# Patient Record
Sex: Female | Born: 1975 | Race: Black or African American | Hispanic: No | Marital: Married | Smoking: Never smoker
Health system: Southern US, Community
[De-identification: ages and names within clinical notes are randomized; demographics above are authoritative.]

## PROBLEM LIST (undated history)

## (undated) DIAGNOSIS — J45909 Unspecified asthma, uncomplicated: Secondary | ICD-10-CM

## (undated) DIAGNOSIS — I10 Essential (primary) hypertension: Secondary | ICD-10-CM

## (undated) DIAGNOSIS — T7840XA Allergy, unspecified, initial encounter: Secondary | ICD-10-CM

## (undated) HISTORY — DX: Allergy, unspecified, initial encounter: T78.40XA

## (undated) HISTORY — PX: BREAST SURGERY: SHX581

## (undated) HISTORY — DX: Unspecified asthma, uncomplicated: J45.909

---

## 2000-08-16 ENCOUNTER — Ambulatory Visit: Admission: RE | Admit: 2000-08-16 | Discharge: 2000-08-16 | Payer: Self-pay | Admitting: Family Medicine

## 2001-03-02 ENCOUNTER — Ambulatory Visit (HOSPITAL_BASED_OUTPATIENT_CLINIC_OR_DEPARTMENT_OTHER): Admission: RE | Admit: 2001-03-02 | Discharge: 2001-03-03 | Payer: Self-pay | Admitting: Specialist

## 2001-03-02 ENCOUNTER — Encounter (INDEPENDENT_AMBULATORY_CARE_PROVIDER_SITE_OTHER): Payer: Self-pay | Admitting: Specialist

## 2006-04-14 ENCOUNTER — Ambulatory Visit: Payer: Self-pay | Admitting: Gastroenterology

## 2008-05-12 ENCOUNTER — Emergency Department (HOSPITAL_COMMUNITY): Admission: EM | Admit: 2008-05-12 | Discharge: 2008-05-12 | Payer: Self-pay | Admitting: Emergency Medicine

## 2011-02-06 ENCOUNTER — Emergency Department (HOSPITAL_BASED_OUTPATIENT_CLINIC_OR_DEPARTMENT_OTHER)
Admission: EM | Admit: 2011-02-06 | Discharge: 2011-02-06 | Disposition: A | Discharge status: Home / Self Care | Payer: BC Managed Care – PPO | Attending: Emergency Medicine | Admitting: Emergency Medicine

## 2011-02-06 DIAGNOSIS — R1011 Right upper quadrant pain: Secondary | ICD-10-CM | POA: Insufficient documentation

## 2011-02-06 DIAGNOSIS — J45909 Unspecified asthma, uncomplicated: Secondary | ICD-10-CM | POA: Insufficient documentation

## 2011-02-06 DIAGNOSIS — I10 Essential (primary) hypertension: Secondary | ICD-10-CM | POA: Insufficient documentation

## 2011-02-06 LAB — DIFFERENTIAL
Eosinophils Absolute: 0.3 10*3/uL (ref 0.0–0.7)
Eosinophils Relative: 3 % (ref 0–5)
Lymphocytes Relative: 27 % (ref 12–46)
Lymphs Abs: 2.9 10*3/uL (ref 0.7–4.0)
Monocytes Absolute: 0.6 10*3/uL (ref 0.1–1.0)
Monocytes Relative: 6 % (ref 3–12)

## 2011-02-06 LAB — URINALYSIS, ROUTINE W REFLEX MICROSCOPIC
Glucose, UA: NEGATIVE mg/dL
Hgb urine dipstick: NEGATIVE
Ketones, ur: NEGATIVE mg/dL
Protein, ur: NEGATIVE mg/dL
Urobilinogen, UA: 0.2 mg/dL (ref 0.0–1.0)

## 2011-02-06 LAB — COMPREHENSIVE METABOLIC PANEL
Alkaline Phosphatase: 66 U/L (ref 39–117)
BUN: 7 mg/dL (ref 6–23)
Creatinine, Ser: 0.7 mg/dL (ref 0.4–1.2)
Glucose, Bld: 101 mg/dL — ABNORMAL HIGH (ref 70–99)
Potassium: 4 mEq/L (ref 3.5–5.1)
Total Protein: 7.5 g/dL (ref 6.0–8.3)

## 2011-02-06 LAB — CBC
HCT: 38.7 % (ref 36.0–46.0)
MCH: 29.2 pg (ref 26.0–34.0)
MCHC: 34.6 g/dL (ref 30.0–36.0)
MCV: 84.3 fL (ref 78.0–100.0)
Platelets: 402 10*3/uL — ABNORMAL HIGH (ref 150–400)
RDW: 13.1 % (ref 11.5–15.5)
WBC: 10.9 10*3/uL — ABNORMAL HIGH (ref 4.0–10.5)

## 2011-02-06 LAB — LIPASE, BLOOD: Lipase: 22 U/L (ref 11–59)

## 2011-02-15 NOTE — Op Note (Signed)
[place]. [facility]  Patient:    Shannon Martinez, Shannon Martinez                     MRN: [id number] Proc. Date: 03/02/01 Adm. Date:  [id number] Attending:  Gustavus Messing                           Operative Report  PROCEDURES: 1. Bilateral breast reductions using the inferior pedicle technique. 2. Reduction mammoplasty. 3. Excision of accessory breast tissue.  SURGEON:  Yaakov Guthrie. Shon Hough, M.D.  ASSISTANT:  Margaretha Sheffield.  ANESTHESIA:  General.  CLINICAL NOTE:  This is a 35 year old lady with severe, severe macromastia, back and shoulder pain secondary to the large, pendulous breasts.  She has increased intertrigo and accessory breast tissue.  DESCRIPTION OF PROCEDURE:  Preoperatively, the patient was set up and drawn for the reduction mammoplasty.  She then underwent general anesthesia, intubated orally.  Prep was done to the chest and breast areas in a routine fashion using Betadine soap and solution and walled off with sterile towels and drapes so as to make a sterile field.  Xylocaine 0.25% with epinephrine 1:400,000 concentration was injected locally into the incision line.  The lines were scored with a #15 blade, then the skin over the inferior pedicles de-epithelialized with a #20 blade.  The medial and lateral fatty dermal pedicles were excised down to fascia.  Out laterally, more tissue was taken, as well as accessory breast tissue over the latissimus dorsi-axillary regions. After proper hemostasis, the flaps were then transposed after the new keyhole area was debulked and stayed with 3-0 Prolene.  Subcutaneous closed with 3-0 Monocryl x 2 layers and then a running subcuticular stitch of 3-0 Monocryl. The drains were placed, #10 Blake, fully-fluted, and then brought out through the lateralmost portion of the incision and secured with 3-0 Prolene.  The wounds were cleansed.  Steri-Strips and soft dressing were applied to all the areas.  She  withstood the procedures very well, was taken to the recovery room in good condition. DD:  03/02/01 TD:  03/02/01 Job: [id number]

## 2011-02-15 NOTE — Assessment & Plan Note (Signed)
[name]                           GASTROENTEROLOGY OFFICE NOTE   Shannon Martinez, SURMAN                     MRN:          [id number]  DATE:04/14/2006                            DOB:          05-09-1976    HISTORY:  This 35 year old African American female is self referred for  evaluation of reflux symptoms and abdominal pain.   HISTORY OF PRESENT ILLNESS:  She notes intermittent nighttime heartburn and  indigestion associated with nausea and regurgitation.  She has had  intermittent chest and epigastric pain, mainly related to the burning  symptoms.  She notes long-term problems with gas and bloating.  She has  tried Beano and Gas-X without any help.  All the above symptoms have been  present for the last few months.  She uses Tums quite frequently and notes  significant belching, as well.  For about the past week, she has had very  loose stools that are dark brown in color but not black, tarry, sticky, red,  or maroon.  She notes some lower abdominal cramping.  She states her  appetite is decreased.  However, she has gained about 15 pounds since  December.  She has no dysphagia, odynophagia, change in stool caliber,  melena, or hematochezia.  No family members with colon cancer, colon polyps,  or inflammatory bowel disease.   PAST MEDICAL HISTORY:  Hypertension, asthma.   PAST SURGICAL HISTORY:  Status post breast surgery in 2000.   MEDICATIONS:  Listed on the chart, updated and reviewed.   SOCIAL HISTORY:  Per the handwritten form.   REVIEW OF SYSTEMS:  Several areas positive per the handwritten form.   PHYSICAL EXAMINATION:  GENERAL:  Overweight, in no acute distress.  VITAL SIGNS:  Height 5 feet 4 inches, weight 204.8 pounds.  Blood pressure  122/86, pulse 78 and regular.  HEENT:  Anicteric sclerae.  Oropharynx clear.  CHEST:  Clear to auscultation bilaterally.  CARDIAC:  Regular rate and rhythm without murmurs appreciated.  ABDOMEN:  Soft with mild epigastric to deep palpation.  No rebound or  guarding.  No palpable organomegaly, masses or hernias.  Normoactive bowel  sounds.  EXTREMITIES:  Without clubbing, cyanosis or edema.  NEUROLOGIC:  Alert and oriented x 3.  Grossly nonfocal.   ASSESSMENT AND PLAN:  Gastroesophageal reflux disease associated with  intestinal gas and a recent change in bowel habits.  She may take Prilosec  20 mg OTC one p.o. q.a.m.  Begin a low-gas diet and standard antireflux  measures.  Obtain stool Hemoccults.  Risks, benefits, and alternatives to  upper endoscopy with possible biopsy discussed with the patient and she  consents to proceed.  This will be scheduled electively.  Consider abdominal  ultrasound imaging  and blood work if her upper endoscopy is not diagnostic and her symptoms do  not respond to Prilosec.                                   [name]. Pleas Koch., MD, Clementeen Graham   MTS/MedQ  DD:  04/27/2006  DT:  04/27/2006  Job #:  [id number]

## 2011-02-19 ENCOUNTER — Other Ambulatory Visit: Payer: Self-pay | Admitting: Family Medicine

## 2011-02-19 DIAGNOSIS — R102 Pelvic and perineal pain: Secondary | ICD-10-CM

## 2011-03-04 ENCOUNTER — Ambulatory Visit
Admission: RE | Admit: 2011-03-04 | Discharge: 2011-03-04 | Disposition: A | Discharge status: Home / Self Care | Payer: BC Managed Care – PPO | Source: Ambulatory Visit | Attending: Family Medicine | Admitting: Family Medicine

## 2011-03-04 DIAGNOSIS — R102 Pelvic and perineal pain: Secondary | ICD-10-CM

## 2011-06-28 LAB — URINALYSIS, ROUTINE W REFLEX MICROSCOPIC
Bilirubin Urine: NEGATIVE
Ketones, ur: NEGATIVE
Nitrite: NEGATIVE
Specific Gravity, Urine: 1.027
Urobilinogen, UA: 0.2

## 2011-06-28 LAB — URINE MICROSCOPIC-ADD ON

## 2011-06-28 LAB — POCT PREGNANCY, URINE: Preg Test, Ur: NEGATIVE

## 2011-09-02 ENCOUNTER — Other Ambulatory Visit: Payer: Self-pay | Admitting: Family Medicine

## 2011-09-02 ENCOUNTER — Ambulatory Visit
Admission: RE | Admit: 2011-09-02 | Discharge: 2011-09-02 | Disposition: A | Discharge status: Home / Self Care | Payer: BC Managed Care – PPO | Source: Ambulatory Visit | Attending: Family Medicine | Admitting: Family Medicine

## 2011-09-03 ENCOUNTER — Inpatient Hospital Stay: Admission: RE | Admit: 2011-09-03 | Payer: BC Managed Care – PPO | Source: Ambulatory Visit

## 2011-11-19 ENCOUNTER — Other Ambulatory Visit: Payer: Self-pay | Admitting: Family Medicine

## 2011-12-29 ENCOUNTER — Ambulatory Visit (INDEPENDENT_AMBULATORY_CARE_PROVIDER_SITE_OTHER): Payer: BC Managed Care – PPO | Admitting: Family Medicine

## 2011-12-29 DIAGNOSIS — K209 Esophagitis, unspecified without bleeding: Secondary | ICD-10-CM

## 2011-12-29 DIAGNOSIS — K219 Gastro-esophageal reflux disease without esophagitis: Secondary | ICD-10-CM

## 2011-12-29 MED ORDER — GI COCKTAIL ~~LOC~~: 30.0000 mL | Freq: Once | ORAL | Status: DC

## 2011-12-29 MED ORDER — SUCRALFATE 1 G PO TABS: 1.0000 g | ORAL_TABLET | Freq: Two times a day (BID) | ORAL | Status: AC

## 2011-12-29 MED ORDER — SUCRALFATE 1 G PO TABS: 1.0000 g | ORAL_TABLET | Freq: Two times a day (BID) | ORAL | Status: DC

## 2011-12-29 NOTE — Progress Notes (Signed)
  Subjective:    Patient ID: Shannon Martinez, female    DOB: 06-24-1976, 36 y.o.   MRN: [id number]  HPI  Patient presents complaining of burning chest pain.  Continues to experience pain in center of chest and now in center of back.  Took Prevacid, (2) antacids and then strip of Gas X; symptoms eased then returned today.  Drank sprite to relieve symptoms burped however did not relieve symptoms  Feels sore anterior chest  Symptoms started after coffee/waffles/burger/onion rings  Denies SOB Review of Systems     Objective:   Physical Exam  Constitutional: She appears well-developed.  HENT:  Mouth/Throat: Posterior oropharyngeal erythema present.  Neck: Neck supple.  Cardiovascular: Normal rate, regular rhythm and normal heart sounds.   Pulmonary/Chest: Effort normal.  Abdominal: Soft. There is tenderness (epigastrum).  Neurological: She is alert.      Symptoms relieved by GI cocktail    Assessment & Plan:   1. Chest pain  EKG 12-Lead  2. GERD (gastroesophageal reflux disease)  gi cocktail (Maalox,Lidocaine,Donnatal), H. pylori antibody, IgG  3. Esophagitis  sucralfate (CARAFATE) 1 G tablet   Patient education information provided regarding GERD and esophagitis Reviewed dietary options that would prevent aggravation of symptoms

## 2012-01-08 ENCOUNTER — Ambulatory Visit (INDEPENDENT_AMBULATORY_CARE_PROVIDER_SITE_OTHER): Payer: BC Managed Care – PPO | Admitting: Emergency Medicine

## 2012-01-08 VITALS — BP 112/80 | HR 71 | Temp 98.0°F | Resp 16 | Ht 64.0 in | Wt 204.0 lb

## 2012-01-08 DIAGNOSIS — T7840XA Allergy, unspecified, initial encounter: Secondary | ICD-10-CM

## 2012-01-08 MED ORDER — DIPHENHYDRAMINE HCL 50 MG/ML IJ SOLN
50.0000 mg | Freq: Once | INTRAMUSCULAR | Status: AC
  Administered 2012-01-08: 50 mg via INTRAMUSCULAR

## 2012-01-08 MED ORDER — EPINEPHRINE 0.3 MG/0.3ML IJ DEVI: 0.3000 mg | Freq: Once | INTRAMUSCULAR | Status: AC

## 2012-01-08 NOTE — Progress Notes (Signed)
  Subjective:    Patient ID: Shannon Martinez, female    DOB: 25-Oct-1975, 36 y.o.   MRN: [id number]  HPI patient presents after coming in contact with latex approximately 30 minutes ago. She has a severe latex allergy. Apparently someone handed balloons to her and they rubbed up against her skin. She enters now with itching of her arms around her lips and also on the front of her legs. She has not broken out in a rash. She does not have any swelling of her tongue or lips. She is having no wheezing. She's not having chest pain    Review of Systems are as relates to the present illness the     Objective:   Physical Exam  Constitutional: She appears well-developed and well-nourished.  Neck: No tracheal deviation present. No thyromegaly present.  Cardiovascular: Normal rate and regular rhythm.  Exam reveals no gallop and no friction rub.   No murmur heard. Pulmonary/Chest: No respiratory distress. She has no wheezes. She has no rales. She exhibits no tenderness.  Skin:       There are no hives noted. There is no outbreak noted on her skin. He immediately washed her scan WITH water.          Assessment & Plan:  Patient here with pruritus of the skin following latex exposure. She was given 50 of Benadryl IM 10 mg of Zyrtec by mouth. We'll observe for now. We'll make sure she has a prescription for an EpiPen for emergencies. Following administration of IM Benadryl and oral Zyrtec patient had no progression of her allergic symptoms. We'll send in a prescription for an EpiPen dual pack for her to have she can continue Benadryl 25 1-2 every 4-6 hours.

## 2012-02-16 ENCOUNTER — Ambulatory Visit (INDEPENDENT_AMBULATORY_CARE_PROVIDER_SITE_OTHER): Payer: BC Managed Care – PPO | Admitting: Family Medicine

## 2012-02-16 ENCOUNTER — Ambulatory Visit: Payer: BC Managed Care – PPO

## 2012-02-16 VITALS — BP 139/87 | HR 60 | Temp 98.5°F | Resp 16 | Ht 64.0 in | Wt 203.0 lb

## 2012-02-16 DIAGNOSIS — M79603 Pain in arm, unspecified: Secondary | ICD-10-CM

## 2012-02-16 DIAGNOSIS — M542 Cervicalgia: Secondary | ICD-10-CM

## 2012-02-16 DIAGNOSIS — M79609 Pain in unspecified limb: Secondary | ICD-10-CM

## 2012-02-16 MED ORDER — CYCLOBENZAPRINE HCL 5 MG PO TABS: ORAL_TABLET | ORAL | Status: AC

## 2012-02-16 MED ORDER — MELOXICAM 7.5 MG PO TABS: ORAL_TABLET | ORAL | Status: DC

## 2012-02-16 NOTE — Progress Notes (Signed)
  Subjective:    Patient ID: Shannon Martinez, female    DOB: 10-24-1975, 36 y.o.   MRN: [id number]  HPI Shannon Martinez is a 36 y.o. female Complains of L neck to shoulder to arm pain (running from L neck down) - noticed a week ago.  Numbness in L thumb for 3-4 days.  No chest pain/SOB.  Pain only - no weakness.   [facility].  ? muscle spasm.   Primarily R hand dominant.  Tx: tens unit (has for back) -x3 - no relief.  Naprosyn x past 2 days - no relief., ice pack, moist heat - no relief.  housing auditor.    Review of Systems  Constitutional: Negative for activity change.  HENT: Positive for neck pain and neck stiffness.   Respiratory: Negative for chest tightness and shortness of breath.   Cardiovascular: Negative for chest pain.  Musculoskeletal: Positive for arthralgias.          Objective:   Physical Exam  Constitutional: She is oriented to person, place, and time. She appears well-developed and well-nourished.  Cardiovascular: Normal rate, regular rhythm, normal heart sounds and intact distal pulses.   Pulmonary/Chest: Effort normal and breath sounds normal.  Musculoskeletal:       Left shoulder: Normal. She exhibits normal range of motion.       Cervical back: She exhibits decreased range of motion, tenderness, pain and spasm. She exhibits no bony tenderness.       Back:  Neurological: She is alert and oriented to person, place, and time. She has normal strength and normal reflexes. No cranial nerve deficit or sensory deficit. She exhibits normal muscle tone.  Reflex Scores:      Tricep reflexes are 2+ on the right side and 2+ on the left side.      Bicep reflexes are 2+ on the right side and 2+ on the left side.      Brachioradialis reflexes are 2+ on the right side and 2+ on the left side. Skin: Skin is warm and dry. No rash noted.  Psychiatric: She has a normal mood and affect. Her behavior is normal.   [facility] reading  (PRIMARY) by  Dr. Neva Seat: C-spine:. Decreased lordosis, o/w negative.      Assessment & Plan:  Shannon Martinez is a 36 y.o. female L neck pain, muscle spasm, with pain and dysesthesia to thumb (C6 distribution).  Discussed prednisone, but declined at this point. Can try  flexeril 5 mg QHS  to Q8 prn, trial of Mobic 7.5 mg 1-2 qd, continue heat, gentle rom and stretches per neck manual, and if not improving in next week to 10 days, would consider prednisone taper.  Consider MRI C-spine if no improvement with this.

## 2012-02-18 ENCOUNTER — Telehealth: Payer: Self-pay

## 2012-02-18 NOTE — Telephone Encounter (Signed)
PATIENT SAW DR. Neva Seat FOR HER SHOULDER PAIN.  SHE WOULD LIKE A NOTE OR PRESCRIPTION FOR ANY ORTHOPAEDIC DEVICE SHE CAN USE TO HELP HER FOR HER PROBLEM, SUCH AS A PILLOW.  HER COMPANY WILL REIMBURSE HER FOR ANYTHING LIKE THIS.  PLEASE CALL.

## 2012-02-19 NOTE — Telephone Encounter (Signed)
Dr Neva Seat, is there any ortho device you can Rx or suggest for pt?

## 2012-02-20 NOTE — Telephone Encounter (Signed)
Called patient mailbox has not been set up so unable to leave message

## 2012-02-20 NOTE — Telephone Encounter (Signed)
Pt CB and stated she has already purchased an ortho pillow that is called "Soothe and Ciser" pillow that is designed to use for neck pain. She is using it as directed which is to use periodically for 10-15 mins at a time. She is doing this after using the moist heat as directed by Dr Neva Seat and then switches to her reg pillow to try to sleep. Her employer has said that if Dr Neva Seat writes a letter/order for pt to use this pillow, they will reimburse her for it since it is for her medical condition. Asked pt about her current status, and she isn't really getting any relief yet - still having to take the pain med/ibuprofen, and would like to try the prednisone if the doctor feels that it might help.

## 2012-02-20 NOTE — Telephone Encounter (Signed)
Because this was likely neck related, I don't know of a particular brace that would help.  Is she taking the meds prescribed?  We could try prednisone sooner if she is not getting any relief.

## 2012-03-02 NOTE — Telephone Encounter (Signed)
Notified pt of note being ready for p/up and gave her Dr Paralee Cancel f/up instr's. Pt agreed.

## 2012-03-02 NOTE — Telephone Encounter (Signed)
Call pt - follow up with physical therapy, and if not improving - return to clinic to recheck and discuss prednisone.  Also - advise her that a note on a  prescription is up front for pickup for the use of her neck brace.

## 2012-03-02 NOTE — Telephone Encounter (Signed)
Pt called back to check status of letter/order for ortho pillow. Also, pt wanted Dr Neva Seat to know that she has started therapy back up at [facility] as he had suggested.

## 2012-03-18 ENCOUNTER — Telehealth: Payer: Self-pay

## 2012-03-18 NOTE — Telephone Encounter (Signed)
BLOOD PRESSURE MEDICINE diltiazem (TIAZAC) 120 MG 24 hr capsule hydrochlorothiazide (MICROZIDE) 12.5 MG capsule  [facility] [address]  BEST NUMBER IS [phone]

## 2012-03-19 ENCOUNTER — Other Ambulatory Visit: Payer: Self-pay | Admitting: Physician Assistant

## 2012-03-19 MED ORDER — DILTIAZEM HCL ER BEADS 120 MG PO CP24: 120.0000 mg | ORAL_CAPSULE | Freq: Every day | ORAL | Status: DC

## 2012-03-19 NOTE — Telephone Encounter (Signed)
Sent in one refill to her pharmacy.

## 2012-04-24 ENCOUNTER — Other Ambulatory Visit: Payer: Self-pay | Admitting: Physician Assistant

## 2012-04-24 NOTE — Telephone Encounter (Signed)
Pt is currently at [facility] stating that we only called in one rx and not the other. She will be waiting since she is going out of town this evening.  [phone]  [facility] - [place], [place] - [address]

## 2012-04-26 ENCOUNTER — Telehealth: Payer: Self-pay | Admitting: *Deleted

## 2012-04-26 NOTE — Telephone Encounter (Signed)
Dala Dock Ribarsch 04/24/2012 4:36 PM Signed  Pt is currently at [facility] stating that we only called in one rx and not the other. She will be waiting since she is going out of town this evening.  [phone]  [facility] - [place], [place] - [address]

## 2012-05-10 ENCOUNTER — Telehealth: Payer: Self-pay | Admitting: Family Medicine

## 2012-05-10 NOTE — Telephone Encounter (Signed)
Outside call for blood pressure 163/100.  Calling about ha and elevated blood pressure - wanted to know if something can be called in. Having headaches - 144/90, 153/100.  Taking normal blood pressure meds - hctz 12.5mg  and diltiazem 120mg  - at 10 am this morning.  Took extra dose of hctz 12.5 at 5pm tonight.  Headache and slightnausea, no slurred speech.  No chest pains.  Just feels tired.  No focal weakness. Feels a little blurry vision when looking up stuff online tonight - trouble with initial focus, but otherwise ok. . No recent labs for blood pressure meds. Usually pressure under better control.  No missed doses.  Discussed eval at ER tonight if blurry vision or ha/nausea not improving in next few hours - sooner if any worsening.  Recheck in clinic tomorrow to eval control as long as improving tonight.  Understanding expressed.

## 2012-05-11 ENCOUNTER — Ambulatory Visit (INDEPENDENT_AMBULATORY_CARE_PROVIDER_SITE_OTHER): Payer: BC Managed Care – PPO | Admitting: Family Medicine

## 2012-05-11 VITALS — BP 142/108 | HR 69 | Temp 98.0°F | Resp 16 | Ht 64.0 in | Wt 197.8 lb

## 2012-05-11 DIAGNOSIS — I10 Essential (primary) hypertension: Secondary | ICD-10-CM

## 2012-05-11 MED ORDER — HYDROCHLOROTHIAZIDE 25 MG PO TABS: 25.0000 mg | ORAL_TABLET | Freq: Every day | ORAL | Status: DC

## 2012-05-11 NOTE — Patient Instructions (Addendum)
Your should receive a call or letter about your lab results within the next week to 10 days.   Increasing hctz to 25mg  each day.  (take 2 of 12.5mg  capsules until out, then start 25 mg tab  - 1 each day). Continue diltiazem 120mg  each day.  Recheck in next 6 months.  Return to the clinic or go to the nearest emergency room if any of your symptoms worsen or new symptoms occur.

## 2012-05-11 NOTE — Progress Notes (Signed)
  Subjective:    Patient ID: Shannon Martinez, female    DOB: 06-30-1976, 36 y.o.   MRN: [id number]  HPI Shannon Martinez is a 36 y.o. female Hx of HTN - last creatinine 0.75 in 11/12. See outside call last night. Had headache and elevated blood pressures. Takes taztia xt 120mg  qd, hctz 12.5mg  qd.  Denies recent missed doses. Took extra dose of hctz 12.5mg  last night. (had been on 25mg  hctz in past but decresed as blood pressure was improved.   Has been losing weight with running 4x/week.  Had eaten barbecue few days ago - pork.   BP remained high overnight., no chest pains.  Headache resolved, then back this morning.  No blurry vision or weakness. Home bp's 153/101.    Fasting today - has not taken meds yet.  Usually takes meds at 10:30.  Review of Systems  Constitutional: Negative for fatigue and unexpected weight change.  Eyes: Negative for visual disturbance.  Respiratory: Negative for chest tightness and shortness of breath.   Cardiovascular: Negative for chest pain, palpitations and leg swelling.  Gastrointestinal: Negative for abdominal pain and blood in stool.  Neurological: Positive for headaches. Negative for dizziness, syncope and light-headedness.   As above.       Objective:   Physical Exam  Constitutional: She is oriented to person, place, and time. She appears well-developed and well-nourished.  HENT:  Head: Normocephalic and atraumatic.  Eyes: Conjunctivae and EOM are normal. Pupils are equal, round, and reactive to light.  Neck: Carotid bruit is not present.  Cardiovascular: Normal rate, regular rhythm, normal heart sounds and intact distal pulses.   Pulmonary/Chest: Effort normal and breath sounds normal.  Abdominal: Soft. She exhibits no pulsatile midline mass. There is no tenderness.  Neurological: She is alert and oriented to person, place, and time.  Skin: Skin is warm and dry.  Psychiatric: She has a normal mood and affect. Her behavior is normal.          Assessment & Plan:  Shannon Martinez is a 36 y.o. female 1. HTN (hypertension)  Comprehensive metabolic panel, Lipid panel  likely elevated with sodium intake.  Can try higher dose of hctz 25mg , continue diltiazem at same dose.   Rtc/er precautions.   Patient Instructions  Your should receive a call or letter about your lab results within the next week to 10 days.   Increasing hctz to 25mg  each day.  (take 2 of 12.5mg  capsules until out, then start 25 mg tab  - 1 each day). Continue diltiazem 120mg  each day.  Recheck in next 6 months.  Return to the clinic or go to the nearest emergency room if any of your symptoms worsen or new symptoms occur.

## 2012-05-12 LAB — COMPREHENSIVE METABOLIC PANEL
ALT: 18 U/L (ref 0–35)
BUN: 9 mg/dL (ref 6–23)
CO2: 30 mEq/L (ref 19–32)
Calcium: 9.7 mg/dL (ref 8.4–10.5)
Chloride: 100 mEq/L (ref 96–112)
Creat: 0.88 mg/dL (ref 0.50–1.10)
Glucose, Bld: 92 mg/dL (ref 70–99)

## 2012-05-12 LAB — LIPID PANEL
Cholesterol: 157 mg/dL (ref 0–200)
Total CHOL/HDL Ratio: 3.5 Ratio

## 2012-05-25 ENCOUNTER — Other Ambulatory Visit: Payer: Self-pay | Admitting: Physician Assistant

## 2012-06-05 ENCOUNTER — Ambulatory Visit (INDEPENDENT_AMBULATORY_CARE_PROVIDER_SITE_OTHER): Payer: BC Managed Care – PPO | Admitting: Emergency Medicine

## 2012-06-05 VITALS — BP 130/86 | HR 71 | Temp 98.5°F | Resp 18 | Ht 63.5 in | Wt 201.0 lb

## 2012-06-05 DIAGNOSIS — IMO0002 Reserved for concepts with insufficient information to code with codable children: Secondary | ICD-10-CM

## 2012-06-05 DIAGNOSIS — A088 Other specified intestinal infections: Secondary | ICD-10-CM

## 2012-06-05 DIAGNOSIS — L02419 Cutaneous abscess of limb, unspecified: Secondary | ICD-10-CM

## 2012-06-05 MED ORDER — ONDANSETRON 8 MG PO TBDP: 8.0000 mg | ORAL_TABLET | Freq: Three times a day (TID) | ORAL | Status: AC | PRN

## 2012-06-05 MED ORDER — SULFAMETHOXAZOLE-TRIMETHOPRIM 800-160 MG PO TABS: 1.0000 | ORAL_TABLET | Freq: Two times a day (BID) | ORAL | Status: AC

## 2012-06-05 NOTE — Progress Notes (Signed)
Date:  06/05/2012   Name:  Shannon Martinez   DOB:  1976/08/14   MRN:  [id number] Gender: female Age: 36 y.o.  PCP:  Dois Davenport., MD    Chief Complaint: Insect Bite   History of Present Illness:  Shannon Martinez is a 36 y.o. pleasant patient who presents with the following:  Up in [place] over the past weekend cleaning out her mother's house and was "bitten by a spider" three times while she was sleeping.  She has a lesion on her neck and two on her left arm that are pruritic but are not painful or tender.  The week prior to her trip on Tuesday, she developed a clammy sweaty feeling and felt general malaise associated with vomiting and diarrhea after eating at [facility].  Had a similar episode while in [place].  Also associated with vomiting and diarrhea which was watery but no blood, mucous or pus.  Not associated with fever or chills, abdominal pain.  Says the symptoms resolved in about 15 minutes.  There is no problem list on file for this patient.   No past medical history on file.  No past surgical history on file.  History  Substance Use Topics  . Smoking status: Never Smoker   . Smokeless tobacco: Not on file  . Alcohol Use: Yes     social    No family history on file.  Allergies  Allergen Reactions  . Latex   . Lisinopril Cough  . Penicillins   . Losartan Rash    Medication list has been reviewed and updated.  Current Outpatient Prescriptions on File Prior to Visit  Medication Sig Dispense Refill  . albuterol (PROVENTIL HFA;VENTOLIN HFA) 108 (90 BASE) MCG/ACT inhaler Inhale 2 puffs into the lungs every 6 (six) hours as needed.      Marland Kitchen albuterol (PROVENTIL) (2.5 MG/3ML) 0.083% nebulizer solution Take 2.5 mg by nebulization every 6 (six) hours as needed.      . cetirizine (ZYRTEC) 10 MG tablet Take 10 mg by mouth daily.      Marland Kitchen EPINEPHrine (EPI-PEN) 0.3 mg/0.3 mL DEVI Inject 0.3 mLs (0.3 mg total) into the muscle once.  2 Device  3  . Flaxseed,  Linseed, (FLAX SEED OIL) 1000 MG CAPS Take by mouth daily.      . flunisolide (AEROBID) 250 MCG/ACT inhaler Inhale 2 puffs into the lungs 2 (two) times daily.      . hydrochlorothiazide (HYDRODIURIL) 25 MG tablet Take 1 tablet (25 mg total) by mouth daily.  90 tablet  3  . lansoprazole (PREVACID) 30 MG capsule TAKE ONE CAPSULE BY MOUTH EVERY DAY  30 capsule  2  . meloxicam (MOBIC) 7.5 MG tablet Take 1 to 2 tablets per day as needed for pain/inflammation.  30 tablet  0  . Multiple Vitamins-Minerals (MULTIVITAMIN WITH MINERALS) tablet Take 1 tablet by mouth daily.      Marland Kitchen TAZTIA XT 120 MG 24 hr capsule TAKE ONE CAPSULE BY MOUTH EVERY DAY  30 each  5  . sucralfate (CARAFATE) 1 G tablet Take 1 tablet (1 g total) by mouth 2 (two) times daily.  30 tablet  1   Current Facility-Administered Medications on File Prior to Visit  Medication Dose Route Frequency Provider Last Rate Last Dose  . gi cocktail (Maalox,Lidocaine,Donnatal)  30 mL Oral Once Dois Davenport, MD        Review of Systems:  As per HPI, otherwise negative.    Physical Examination:  Filed Vitals:   06/05/12 1707  BP: 130/86  Pulse: 71  Temp: 98.5 F (36.9 C)  Resp: 18   Filed Vitals:   06/05/12 1707  Height: 5' 3.5" (1.613 m)  Weight: 201 lb (91.173 kg)   Body mass index is 35.05 kg/(m^2). Ideal Body Weight: Weight in (lb) to have BMI = 25: 143.1    GEN: WDWN, NAD, Non-toxic, Alert & Oriented x 3 HEENT: Atraumatic, Normocephalic.  Ears and Nose: No external deformity. EXTR: No clubbing/cyanosis/edema NEURO: Normal gait.  PSYCH: Normally interactive. Conversant. Not depressed or anxious appearing.  Calm demeanor.  Abdomen soft not tender SKIN:  Multiple small abscesses left arm and neck  Assessment and Plan: Abscess arm and neck History of gastroenteritis Septra   Shannon Dane, MD

## 2012-06-08 ENCOUNTER — Encounter: Payer: Self-pay | Admitting: *Deleted

## 2012-06-08 DIAGNOSIS — M25512 Pain in left shoulder: Secondary | ICD-10-CM

## 2012-06-11 ENCOUNTER — Telehealth: Payer: Self-pay

## 2012-06-11 ENCOUNTER — Ambulatory Visit (INDEPENDENT_AMBULATORY_CARE_PROVIDER_SITE_OTHER): Payer: BC Managed Care – PPO | Admitting: Family Medicine

## 2012-06-11 VITALS — BP 124/78 | HR 86 | Temp 98.3°F | Resp 16 | Ht 63.5 in | Wt 197.2 lb

## 2012-06-11 DIAGNOSIS — N951 Menopausal and female climacteric states: Secondary | ICD-10-CM

## 2012-06-11 DIAGNOSIS — R232 Flushing: Secondary | ICD-10-CM

## 2012-06-11 DIAGNOSIS — R197 Diarrhea, unspecified: Secondary | ICD-10-CM

## 2012-06-11 DIAGNOSIS — R112 Nausea with vomiting, unspecified: Secondary | ICD-10-CM

## 2012-06-11 LAB — POCT CBC
Hemoglobin: 12.7 g/dL (ref 12.2–16.2)
Lymph, poc: 3.8 — AB (ref 0.6–3.4)
MCHC: 30.9 g/dL — AB (ref 31.8–35.4)
MPV: 9.3 fL (ref 0–99.8)
POC Granulocyte: 6 (ref 2–6.9)
POC MID %: 7.9 %M (ref 0–12)
RDW, POC: 14.7 %

## 2012-06-11 LAB — BASIC METABOLIC PANEL
CO2: 28 mEq/L (ref 19–32)
Chloride: 99 mEq/L (ref 96–112)
Glucose, Bld: 82 mg/dL (ref 70–99)
Potassium: 3.8 mEq/L (ref 3.5–5.3)
Sodium: 136 mEq/L (ref 135–145)

## 2012-06-11 LAB — FOLLICLE STIMULATING HORMONE: FSH: 1.6 m[IU]/mL

## 2012-06-11 NOTE — Progress Notes (Signed)
[facility] [address] [place] [address] [phone]  Date:  06/11/2012   Name:  Shannon Martinez   DOB:  Jun 16, 1976   MRN:  [id number]  PCP:  Dois Davenport., MD    Chief Complaint: Follow-up and Insect Bite   History of Present Illness:  Shannon SUTLIFF is a 36 y.o. very pleasant female patient who presents with the following:  She was here about 5 days ago with illness- she had a couple of short episodes of clammy/ sweaty feeling, nausea/ vomiting and diarrhea.  She was noted to have some small abscesses on her left arm and neck, and was started on septra.  These lesions are now better.    Here today for a recheck- the skin infection is better, but she is concerned about her other episodes.   About 2 weeks ago while at work she noted a "hot flash" with feeling very hot and sweating, was able to tell coworker that she did not feel well.  She had to run to the bathroom to vomit, and then had diarrhea.  She was running with sweat and looked pale.  She had abdominal cramps but not pain.  This resolved after about 15 minutes.    Then a couple of days later she was in [place] to visit her mother.  She had another similar episode while at her mother's house.  It again lasted 15 minutes.    Today she had another hot flash, and stood in front of a fan at her job.  She was able to cool herself off, and did not have vomiting.  She decided she should come in for an evaluation.  Currently she feels fine  Her LMP was last month- she should be due soon.  She does not think that she could be pregnant.   She is not aware of any family history of early menopause.    She does not feel like she is having panic or anxiety attacks  In between these episodes she has been able to eat and drink without a problem.  She has started running a few months ago but this is dong well.  No other diarrhea.  No abdominal pain.   No SOB, no chest pain- she has no pain when she is  jogging, and she is up to run/ walking up to 5 miles a few times a week.      Patient Active Problem List  Diagnosis  . Left shoulder pain    No past medical history on file.  No past surgical history on file.  History  Substance Use Topics  . Smoking status: Never Smoker   . Smokeless tobacco: Not on file  . Alcohol Use: Yes     social    No family history on file.  Allergies  Allergen Reactions  . Latex   . Lisinopril Cough  . Penicillins   . Losartan Rash    Medication list has been reviewed and updated.  Current Outpatient Prescriptions on File Prior to Visit  Medication Sig Dispense Refill  . albuterol (PROVENTIL HFA;VENTOLIN HFA) 108 (90 BASE) MCG/ACT inhaler Inhale 2 puffs into the lungs every 6 (six) hours as needed.      Marland Kitchen albuterol (PROVENTIL) (2.5 MG/3ML) 0.083% nebulizer solution Take 2.5 mg by nebulization every 6 (six) hours as needed.      . cetirizine (ZYRTEC) 10 MG tablet Take 10 mg by mouth daily.      Marland Kitchen  EPINEPHrine (EPI-PEN) 0.3 mg/0.3 mL DEVI Inject 0.3 mLs (0.3 mg total) into the muscle once.  2 Device  3  . Flaxseed, Linseed, (FLAX SEED OIL) 1000 MG CAPS Take by mouth daily.      . flunisolide (AEROBID) 250 MCG/ACT inhaler Inhale 2 puffs into the lungs 2 (two) times daily.      . hydrochlorothiazide (HYDRODIURIL) 25 MG tablet Take 1 tablet (25 mg total) by mouth daily.  90 tablet  3  . lansoprazole (PREVACID) 30 MG capsule TAKE ONE CAPSULE BY MOUTH EVERY DAY  30 capsule  2  . meloxicam (MOBIC) 7.5 MG tablet Take 1 to 2 tablets per day as needed for pain/inflammation.  30 tablet  0  . Multiple Vitamins-Minerals (MULTIVITAMIN WITH MINERALS) tablet Take 1 tablet by mouth daily.      . ondansetron (ZOFRAN-ODT) 8 MG disintegrating tablet Take 1 tablet (8 mg total) by mouth every 8 (eight) hours as needed for nausea.  30 tablet  0  . sucralfate (CARAFATE) 1 G tablet Take 1 tablet (1 g total) by mouth 2 (two) times daily.  30 tablet  1  .  sulfamethoxazole-trimethoprim (BACTRIM DS,SEPTRA DS) 800-160 MG per tablet Take 1 tablet by mouth 2 (two) times daily.  20 tablet  0  . TAZTIA XT 120 MG 24 hr capsule TAKE ONE CAPSULE BY MOUTH EVERY DAY  30 each  5   Current Facility-Administered Medications on File Prior to Visit  Medication Dose Route Frequency Provider Last Rate Last Dose  . gi cocktail (Maalox,Lidocaine,Donnatal)  30 mL Oral Once Dois Davenport, MD        Review of Systems:  As per HPI- otherwise negative.   Physical Examination: Filed Vitals:   06/11/12 1220  BP: 124/78  Pulse: 86  Temp: 98.3 F (36.8 C)  Resp: 16   Filed Vitals:   06/11/12 1220  Height: 5' 3.5" (1.613 m)  Weight: 197 lb 3.2 oz (89.449 kg)   Body mass index is 34.38 kg/(m^2). Ideal Body Weight: Weight in (lb) to have BMI = 25: 143.1   GEN: WDWN, NAD, Non-toxic, A & O x 3 HEENT: Atraumatic, Normocephalic. Neck supple. No masses, No LAD.  TM and oropharynx wnl, PEERL, EOMI Ears and Nose: No external deformity. CV: RRR, No M/G/R. No JVD. No thrill. No extra heart sounds. PULM: CTA B, no wheezes, crackles, rhonchi. No retractions. No resp. distress. No accessory muscle use. ABD: S, NT, ND, +BS. No rebound. No HSM. EXTR: No c/c/e NEURO Normal gait.  PSYCH: Normally interactive. Conversant. Not depressed or anxious appearing.  Calm demeanor.   Results for orders placed in visit on 06/11/12  POCT CBC      Component Value Range   WBC 10.6 (*) 4.6 - 10.2 K/uL   Lymph, poc 3.8 (*) 0.6 - 3.4   POC LYMPH PERCENT 35.9  10 - 50 %L   MID (cbc) 0.8  0 - 0.9   POC MID % 7.9  0 - 12 %M   POC Granulocyte 6.0  2 - 6.9   Granulocyte percent 56.2  37 - 80 %G   RBC 4.54  4.04 - 5.48 M/uL   Hemoglobin 12.7  12.2 - 16.2 g/dL   HCT, POC 16.1  09.6 - 47.9 %   MCV 90.5  80 - 97 fL   MCH, POC 28.0  27 - 31.2 pg   MCHC 30.9 (*) 31.8 - 35.4 g/dL   RDW, POC 14.7  Platelet Count, POC 338  142 - 424 K/uL   MPV 9.3  0 - 99.8 fL  POCT URINE PREGNANCY        Component Value Range   Preg Test, Ur Negative       Assessment and Plan: 1. Hot flashes  POCT CBC, Basic metabolic panel, TSH, POCT urine pregnancy, Follicle stimulating hormone  2. Nausea and vomiting    3. Diarrhea     Hot flashes and intermittent attacks of nausea/ vomiting and diarrhea.   Will check labs as above.  Will plan further follow- up pending labs. In the meantime let me know if her symptoms are getting worse, and keep a diary of her symptoms.    Abbe Amsterdam, MD

## 2012-06-11 NOTE — Telephone Encounter (Signed)
Patient was in Thursday and she is having symptoms again. She wants to know if she needs to come back in. She would like to discuss with a nurse. Best # [phone]

## 2012-06-12 NOTE — Telephone Encounter (Signed)
Patient has already been seen today.

## 2012-06-29 ENCOUNTER — Other Ambulatory Visit: Payer: Self-pay | Admitting: Family Medicine

## 2012-07-03 ENCOUNTER — Ambulatory Visit (INDEPENDENT_AMBULATORY_CARE_PROVIDER_SITE_OTHER): Payer: BC Managed Care – PPO | Admitting: Family Medicine

## 2012-07-03 VITALS — BP 120/76 | HR 67 | Temp 98.5°F | Resp 16 | Ht 64.0 in | Wt 198.4 lb

## 2012-07-03 DIAGNOSIS — R5381 Other malaise: Secondary | ICD-10-CM

## 2012-07-03 DIAGNOSIS — Z0000 Encounter for general adult medical examination without abnormal findings: Secondary | ICD-10-CM

## 2012-07-03 DIAGNOSIS — Z01419 Encounter for gynecological examination (general) (routine) without abnormal findings: Secondary | ICD-10-CM

## 2012-07-03 DIAGNOSIS — R5383 Other fatigue: Secondary | ICD-10-CM

## 2012-07-03 DIAGNOSIS — Z124 Encounter for screening for malignant neoplasm of cervix: Secondary | ICD-10-CM

## 2012-07-03 DIAGNOSIS — I10 Essential (primary) hypertension: Secondary | ICD-10-CM

## 2012-07-03 LAB — POCT URINALYSIS DIPSTICK
Bilirubin, UA: NEGATIVE
Ketones, UA: NEGATIVE
Spec Grav, UA: 1.015
pH, UA: 7

## 2012-07-03 LAB — POCT UA - MICROSCOPIC ONLY
Casts, Ur, LPF, POC: NEGATIVE
Mucus, UA: NEGATIVE
Yeast, UA: NEGATIVE

## 2012-07-03 MED ORDER — DILTIAZEM HCL ER BEADS 120 MG PO CP24: 120.0000 mg | ORAL_CAPSULE | Freq: Every day | ORAL | Status: DC

## 2012-07-03 NOTE — Patient Instructions (Signed)
Keeping You Healthy  Get These Tests 1. Blood Pressure- Have your blood pressure checked once a year by your health care provider.  Normal blood pressure is 120/80. 2. Weight- Have your body mass index (BMI) calculated to screen for obesity.  BMI is measure of body fat based on height and weight.  You can also calculate your own BMI at https://www.west-esparza.com/. 3. Cholesterol- Have your cholesterol checked every 5 years starting at age 37 then yearly starting at age 17. 4. Chlamydia, HIV, and other sexually transmitted diseases- Get screened every year until age 45, then within three months of each new sexual provider. 5. Pap Smear- Every 1-3 years; discuss with your health care provider. 6. Mammogram- Every year starting at age 61  Take these medicines  Calcium with Vitamin D-Your body needs 1200 mg of Calcium each day and [id number] IU of Vitamin D daily.  Your body can only absorb 500 mg of Calcium at a time so Calcium must be taken in 2 or 3 divided doses throughout the day.  Multivitamin with folic acid- Once daily if it is possible for you to become pregnant.  Get these Immunizations  Gardasil-Series of three doses; prevents HPV related illness such as genital warts and cervical cancer.  Menactra-Single dose; prevents meningitis.  Tetanus shot- Every 10 years. One dose of Tdap is recommended; I need to check your medical record to see if you have had this 1x dose.  Flu shot-Every year. You declined the Flu vaccine today.  Take these steps 1. Do not smoke-Your healthcare provider can help you quit.  For tips on how to quit go to www.smokefree.gov or call 1-800 QUITNOW. 2. Be physically active- Exercise 5 days a week for at least 30 minutes.  If you are not already physically active, start slow and gradually work up to 30 minutes of moderate physical activity.  Examples of moderate activity include walking briskly, dancing, swimming, bicycling, etc. 3. Breast Cancer- A self breast  exam every month is important for early detection of breast cancer.  For more information and instruction on self breast exams, ask your healthcare provider or [email or web address]. 4. Eat a healthy diet- Eat a variety of healthy foods such as fruits, vegetables, whole grains, low fat milk, low fat cheeses, yogurt, lean meats, poultry and fish, beans, nuts, tofu, etc.  For more information go to www. Thenutritionsource.org 5. Drink alcohol in moderation- Limit alcohol intake to one drink or less per day. Never drink and drive. 6. Depression- Your emotional health is as important as your physical health.  If you're feeling down or losing interest in things you normally enjoy please talk to your healthcare provider about being screened for depression. 7. Dental visit- Brush and floss your teeth twice daily; visit your dentist twice a year. 8. Eye doctor- Get an eye exam at least every 2 years. 9. Helmet use- Always wear a helmet when riding a bicycle, motorcycle, rollerblading or skateboarding. 10. Safe sex- If you may be exposed to sexually transmitted infections, use a condom. 11. Seat belts- Seat belts can save your live; always wear one. 12. Smoke/Carbon Monoxide detectors- These detectors need to be installed on the appropriate level of your home. Replace batteries at least once a year. 13. Skin cancer- When out in the sun please cover up and use sunscreen 15 SPF or higher. 14. Violence- If anyone is threatening or hurting you, please tell your healthcare provider.

## 2012-07-05 NOTE — Progress Notes (Signed)
Quick Note:  Please notify pt that results are normal.   Provide pt with copy of labs. ______ 

## 2012-07-07 ENCOUNTER — Encounter: Payer: Self-pay | Admitting: Family Medicine

## 2012-07-07 DIAGNOSIS — E669 Obesity, unspecified: Secondary | ICD-10-CM | POA: Insufficient documentation

## 2012-07-07 DIAGNOSIS — G43809 Other migraine, not intractable, without status migrainosus: Secondary | ICD-10-CM | POA: Insufficient documentation

## 2012-07-07 DIAGNOSIS — I10 Essential (primary) hypertension: Secondary | ICD-10-CM | POA: Insufficient documentation

## 2012-07-07 LAB — PAP IG, CT-NG, RFX HPV ASCU: Chlamydia Probe Amp: NEGATIVE

## 2012-07-07 NOTE — Progress Notes (Signed)
Subjective:    Patient ID: Shannon Martinez, female    DOB: 03-May-1976, 36 y.o.   MRN: [id number]  HPI  This 36 y.o. AA is here for CPE/PAP. She was seen recently at [facility] with c/o hot flashes  with episodic nausea/vomiting/diarrhea on 2 occasions. She has continued to have fatigue but no  more episodes as described above. Pt has hx of PCOS.   [facility] PAP: Nov 2011(negative/normal)     Review of Systems  Constitutional: Positive for diaphoresis and fatigue. Negative for fever, appetite change and unexpected weight change.  Eyes: Negative.   Respiratory: Negative.   Cardiovascular: Negative.   Gastrointestinal: Positive for nausea and diarrhea. Negative for abdominal pain and blood in stool.  Genitourinary: Negative.   Musculoskeletal: Negative.   Skin: Negative.   Hematological: Negative.   Psychiatric/Behavioral: Negative.        Objective:   Physical Exam  Nursing note and vitals reviewed. Constitutional: She is oriented to person, place, and time. She appears well-developed and well-nourished. No distress.  HENT:  Head: Normocephalic and atraumatic.  Right Ear: Hearing, tympanic membrane, external ear and ear canal normal.  Left Ear: Hearing, tympanic membrane, external ear and ear canal normal.  Nose: No nasal deformity or septal deviation.  Mouth/Throat: Uvula is midline, oropharynx is clear and moist and mucous membranes are normal. No oral lesions. Normal dentition. No dental caries.  Eyes: Conjunctivae normal, EOM and lids are normal. Pupils are equal, round, and reactive to light. No scleral icterus.  Fundoscopic exam:      The right eye shows no arteriolar narrowing, no AV nicking and no papilledema. The right eye shows red reflex.      The left eye shows no arteriolar narrowing, no AV nicking and no papilledema. The left eye shows red reflex. Neck: Normal range of motion. Neck supple. No thyromegaly present.  Cardiovascular: Normal rate, regular rhythm and intact  distal pulses.  Exam reveals no gallop and no friction rub.   No murmur heard. Pulmonary/Chest: Effort normal and breath sounds normal. No respiratory distress. She has no wheezes. She has no rales. Right breast exhibits no inverted nipple, no mass, no nipple discharge, no skin change and no tenderness. Left breast exhibits no inverted nipple, no mass, no nipple discharge, no skin change and no tenderness. Breasts are symmetrical.  Abdominal: Soft. Normal appearance and bowel sounds are normal. She exhibits no distension, no abdominal bruit, no pulsatile midline mass and no mass. There is no hepatosplenomegaly. There is tenderness in the epigastric area. There is no guarding, no CVA tenderness and negative Murphy's sign.  Genitourinary: Rectum normal, vagina normal and uterus normal. There is no rash, tenderness or lesion on the right labia. There is no rash, tenderness or lesion on the left labia. Uterus is not deviated, not enlarged and not tender. Cervix exhibits no motion tenderness, no discharge and no friability. Right adnexum displays no mass, no tenderness and no fullness. Left adnexum displays no mass, no tenderness and no fullness. No erythema, tenderness or bleeding around the vagina. No vaginal discharge found.  Musculoskeletal: Normal range of motion. She exhibits no edema and no tenderness.  Lymphadenopathy:    She has no cervical adenopathy.       Right: No inguinal adenopathy present.       Left: No inguinal adenopathy present.  Neurological: She is alert and oriented to person, place, and time. She has normal reflexes. No cranial nerve deficit. She exhibits normal muscle tone. Coordination  normal.  Skin: Skin is warm and dry. No rash noted. No erythema.  Psychiatric: She has a normal mood and affect. Her behavior is normal. Judgment and thought content normal.         Assessment & Plan:   1. Routine general medical examination at a health care facility  POCT urinalysis dipstick,  POCT UA - Microscopic Only  2. Encounter for cervical Pap smear with pelvic exam  Pap and CT/GC w/ reflex HPV when ASC-U [[facility]]  3. Fatigue  Vitamin D, 25-hydroxy  4. HTN (hypertension) - well controlled Continue current medications

## 2012-07-08 NOTE — Progress Notes (Signed)
Quick Note:  Notify pt of Normal results. ______ 

## 2012-07-22 ENCOUNTER — Encounter: Payer: Self-pay | Admitting: Family Medicine

## 2012-09-01 ENCOUNTER — Other Ambulatory Visit: Payer: Self-pay | Admitting: Physician Assistant

## 2013-01-12 ENCOUNTER — Other Ambulatory Visit: Payer: Self-pay | Admitting: Radiology

## 2013-01-12 MED ORDER — HYDROCHLOROTHIAZIDE 12.5 MG PO CAPS: 12.5000 mg | ORAL_CAPSULE | Freq: Every day | ORAL | Status: DC

## 2013-01-12 NOTE — Telephone Encounter (Signed)
Rx sent in patient advised she is due for follow up

## 2013-02-17 ENCOUNTER — Telehealth: Payer: Self-pay

## 2013-02-17 NOTE — Telephone Encounter (Signed)
Pt states that Dr Patsy Lager had refereed her to [facility] and her company has been paying her copay and she is needing an updated letter stating that it is still okay for her to go So her company will continue to pay her copay Call back number is [phone]

## 2013-02-17 NOTE — Telephone Encounter (Signed)
This should be in [facility]

## 2013-02-25 NOTE — Telephone Encounter (Signed)
Patient has called about updated orders for integrative therapy, I did not see the order in the system, so I thought this may be W/C, but it is not. Can you reorder the integrative therapies for her? Or does she need to return to clinic?

## 2013-02-25 NOTE — Telephone Encounter (Signed)
I called her back- Dr. Hal Hope had sent her to [facility] for neck/ back pain and paraesthesias.   She needs a letter to pick up stating that she needs PT and massage therapy for her neck and back.

## 2013-02-25 NOTE — Telephone Encounter (Signed)
Patient calling again about the letter? From Dr Patsy Lager

## 2013-03-01 ENCOUNTER — Ambulatory Visit (INDEPENDENT_AMBULATORY_CARE_PROVIDER_SITE_OTHER): Payer: BC Managed Care – PPO | Admitting: Physician Assistant

## 2013-03-01 VITALS — BP 130/82 | HR 103 | Temp 99.9°F | Resp 24 | Ht 64.0 in | Wt 208.0 lb

## 2013-03-01 DIAGNOSIS — R112 Nausea with vomiting, unspecified: Secondary | ICD-10-CM

## 2013-03-01 DIAGNOSIS — R197 Diarrhea, unspecified: Secondary | ICD-10-CM

## 2013-03-01 MED ORDER — ONDANSETRON 4 MG PO TBDP: 4.0000 mg | ORAL_TABLET | Freq: Three times a day (TID) | ORAL | Status: DC | PRN

## 2013-03-01 MED ORDER — ONDANSETRON 4 MG PO TBDP: 8.0000 mg | ORAL_TABLET | Freq: Once | ORAL | Status: DC

## 2013-03-01 NOTE — Progress Notes (Signed)
   [address] [place] [address]   Phone [phone]  Subjective:    Patient ID: Shannon Martinez, female    DOB: 05-Feb-1976, 37 y.o.   MRN: [id number]  HPI Pt presents to clinic with 14h h/o stomach illness.  She has diarrhea and vomiting all day.  She feels terrible.  She thought it was what she ate last night but her husband ate the same thing and he only felt a little bad today.  No one at work has similar symptoms.  She has tried OTC meds but they are not helping much.  She is not dizzy when she stands up at night.  She has tried to drink but she is not keeping much down.  OTC - pepto, mylanta, - not helping  Review of Systems  Constitutional: Positive for fever (subjective) and chills.  Gastrointestinal: Positive for nausea, vomiting (>10) and diarrhea (>10 - like water). Negative for abdominal pain and blood in stool.       Objective:   Physical Exam  Vitals reviewed. Constitutional: She appears well-developed and well-nourished.  HENT:  Head: Normocephalic and atraumatic.  Right Ear: External ear normal.  Left Ear: External ear normal.  Cardiovascular: Normal rate, regular rhythm and normal heart sounds.   Pulmonary/Chest: Effort normal and breath sounds normal.  Abdominal: Soft. There is no tenderness.  Skin: Skin is warm and dry.  Psychiatric: She has a normal mood and affect. Her behavior is normal. Judgment and thought content normal.       Assessment & Plan:  Nausea with vomiting -Pt to home and push fluids and advance diet as tolerated.  If she is unable to hydrate over night she should RTC tomorrow for IV hydration. Plan: ondansetron (ZOFRAN-ODT) disintegrating tablet 8 mg, ondansetron (ZOFRAN ODT) 4 MG disintegrating tablet,  Diarrhea  Benny Lennert PA-C 03/01/2013 9:37 PM

## 2013-03-01 NOTE — Patient Instructions (Addendum)
Push fluids - start with ice chips when the nausea improves and then start clear liquids without carbonation.  Then advance to bland foods (rice applesauce toast)

## 2013-04-22 ENCOUNTER — Ambulatory Visit (INDEPENDENT_AMBULATORY_CARE_PROVIDER_SITE_OTHER): Payer: BC Managed Care – PPO | Admitting: Emergency Medicine

## 2013-04-22 ENCOUNTER — Ambulatory Visit: Payer: BC Managed Care – PPO

## 2013-04-22 VITALS — BP 157/97 | HR 84 | Temp 98.0°F | Resp 18 | Ht 63.25 in | Wt 205.0 lb

## 2013-04-22 DIAGNOSIS — R221 Localized swelling, mass and lump, neck: Secondary | ICD-10-CM

## 2013-04-22 DIAGNOSIS — F411 Generalized anxiety disorder: Secondary | ICD-10-CM

## 2013-04-22 MED ORDER — ALPRAZOLAM 1 MG PO TABS: 1.0000 mg | ORAL_TABLET | Freq: Every evening | ORAL | Status: DC | PRN

## 2013-04-22 MED ORDER — ALPRAZOLAM 1 MG PO TABS: ORAL_TABLET | ORAL | Status: DC

## 2013-04-22 NOTE — Progress Notes (Signed)
  Subjective:    Patient ID: Shannon Martinez, female    DOB: 09/25/1976, 37 y.o.   MRN: [id number]  HPI Pt noticed a knot on the right side of her neck yesterday that she feels all the way into her jaw. Nothing on the left side. No ST or ear pain.  No chance of pregnancy.she states she is currently on her period with no possibility of being pregnant.    Review of Systems     Objective:   Physical Examexamination the right TM is normal. The helix of the right ear is normal. There is a soft tissue mass measuring 1 x 2 and half centimeters in a dumbbell shape just posterior to the right earlobe and adjacent to the right TMJ. Thyroid is not enlarged.  [facility] reading (PRIMARY) by  Dr.Daubthere is an abnormal area of the anterior arch of C1 please comment.         Assessment & Plan:  Patient will need further imaging of C1. We'll discuss with the radiologist. Maryclare Labrador ask for stat read.stat 3 there is an abnormal calcification adjacent to the body of C1. Will proceed with CT neck with contrast as recommended by the radiologist.

## 2013-04-22 NOTE — Patient Instructions (Signed)
We will call you once we have your x-rays ordered.

## 2013-04-27 ENCOUNTER — Other Ambulatory Visit: Payer: BC Managed Care – PPO

## 2013-04-27 ENCOUNTER — Ambulatory Visit
Admission: RE | Admit: 2013-04-27 | Discharge: 2013-04-27 | Disposition: A | Discharge status: Home / Self Care | Payer: BC Managed Care – PPO | Source: Ambulatory Visit | Attending: Emergency Medicine | Admitting: Emergency Medicine

## 2013-04-27 ENCOUNTER — Telehealth: Payer: Self-pay | Admitting: Emergency Medicine

## 2013-04-27 DIAGNOSIS — R221 Localized swelling, mass and lump, neck: Secondary | ICD-10-CM

## 2013-04-27 MED ORDER — IOHEXOL 300 MG/ML  SOLN
75.0000 mL | Freq: Once | INTRAMUSCULAR | Status: AC | PRN
  Administered 2013-04-27: 75 mL via INTRAVENOUS

## 2013-04-27 NOTE — Telephone Encounter (Signed)
Call patient again with no answer.

## 2013-04-27 NOTE — Telephone Encounter (Signed)
I was able to speak to the patient and she had removal of a wisdom tooth yesterday. This would explain the air seen in the soft tissue on the CT scan done today.

## 2013-05-01 ENCOUNTER — Telehealth: Payer: Self-pay

## 2013-05-01 NOTE — Telephone Encounter (Signed)
Patient needs refill of Ibuprofen 800 and muscle relaxers.  [phone]

## 2013-05-02 MED ORDER — IBUPROFEN 800 MG PO TABS: 800.0000 mg | ORAL_TABLET | Freq: Three times a day (TID) | ORAL | Status: DC | PRN

## 2013-05-02 NOTE — Telephone Encounter (Signed)
OK to refill meds

## 2013-05-02 NOTE — Telephone Encounter (Signed)
I sent the ibuprofen, but dont see the Muscle relaxer, what muscle relaxer do you want?

## 2013-05-03 NOTE — Telephone Encounter (Signed)
OK to send in a muscle relaxant.

## 2013-05-03 NOTE — Telephone Encounter (Signed)
Advised patient and she will check with the pharmacy and then call me back.

## 2013-05-03 NOTE — Telephone Encounter (Signed)
Left message for her to call me back. 

## 2013-05-03 NOTE — Telephone Encounter (Signed)
Called patient and see if she needs a prescription for her muscle relaxants. If she is on a muscle relaxant let me know.

## 2013-05-03 NOTE — Telephone Encounter (Signed)
Flexeril 10mg  was 2013 when she was given this/ please advise. She wants to try this for this episode of pain, please advise. pended

## 2013-05-04 ENCOUNTER — Other Ambulatory Visit: Payer: Self-pay | Admitting: Emergency Medicine

## 2013-05-04 MED ORDER — CYCLOBENZAPRINE HCL 10 MG PO TABS: 10.0000 mg | ORAL_TABLET | Freq: Three times a day (TID) | ORAL | Status: DC | PRN

## 2013-05-04 NOTE — Telephone Encounter (Signed)
done

## 2013-05-12 ENCOUNTER — Other Ambulatory Visit: Payer: Self-pay | Admitting: Family Medicine

## 2013-08-24 ENCOUNTER — Encounter: Payer: Self-pay | Admitting: Family Medicine

## 2013-08-24 ENCOUNTER — Ambulatory Visit (INDEPENDENT_AMBULATORY_CARE_PROVIDER_SITE_OTHER): Payer: BC Managed Care – PPO | Admitting: Family Medicine

## 2013-08-24 VITALS — BP 136/84 | HR 84 | Temp 98.8°F | Resp 16 | Ht 63.0 in | Wt 213.0 lb

## 2013-08-24 DIAGNOSIS — R1012 Left upper quadrant pain: Secondary | ICD-10-CM

## 2013-08-24 DIAGNOSIS — Z0000 Encounter for general adult medical examination without abnormal findings: Secondary | ICD-10-CM

## 2013-08-24 DIAGNOSIS — E669 Obesity, unspecified: Secondary | ICD-10-CM

## 2013-08-24 DIAGNOSIS — Z1231 Encounter for screening mammogram for malignant neoplasm of breast: Secondary | ICD-10-CM

## 2013-08-24 DIAGNOSIS — R635 Abnormal weight gain: Secondary | ICD-10-CM

## 2013-08-24 LAB — POCT URINALYSIS DIPSTICK
Blood, UA: NEGATIVE
Ketones, UA: NEGATIVE
Protein, UA: NEGATIVE
Spec Grav, UA: >=1.03
pH, UA: 5.5

## 2013-08-24 MED ORDER — DILTIAZEM HCL ER BEADS 120 MG PO CP24: 120.0000 mg | ORAL_CAPSULE | Freq: Every day | ORAL | Status: DC

## 2013-08-24 NOTE — Progress Notes (Signed)
Subjective:    Patient ID: Shannon Martinez, female    DOB: [birth year], 37 y.o.   MRN: [id number]  HPI  This 37 y.o. AA female is here for CPE. She is concerned about weight gain despite practicing healthy lifestyle w/ good nutrition and exercise for > 1 year. Pt has Asthma but not endorsing daily steroid inhaler use. She does use Albuterol before exercise and when symptomatic.  HTN is stable and controlled on current medications w/o adverse effects.  Pt has 1 -week hx of intermittent LUQ abdominal discomfort w/o report of any other GI symptoms. She has been exercising more vigorously, including abdominals, using fitness equipment.  HCM: MMG- s/p mammoplasty and reports imaging study prior to surgery.           PAP- 2013 (negative).           IMM- Declines.  Review of Systems  Constitutional: Positive for unexpected weight change.  Respiratory: Positive for cough.   Gastrointestinal:       LUQ pain w/o other GI symptoms; pt has been doing sit-ups using a machine at the fitness center.  Genitourinary: Positive for vaginal discharge.  Musculoskeletal: Positive for myalgias, neck pain and neck stiffness.  Allergic/Immunologic: Positive for environmental allergies and food allergies.  Neurological: Positive for light-headedness and headaches.       Objective:   Physical Exam  Nursing note and vitals reviewed. Constitutional: She is oriented to person, place, and time. Vital signs are normal. She appears well-developed and well-nourished.  HENT:  Head: Normocephalic and atraumatic.  Right Ear: Hearing, external ear and ear canal normal. Tympanic membrane is scarred.  Left Ear: Hearing, external ear and ear canal normal. Tympanic membrane is scarred.  Nose: Mucosal edema present. No sinus tenderness, nasal deformity or septal deviation.  Mouth/Throat: Uvula is midline and mucous membranes are normal. No oral lesions. No dental caries. Posterior oropharyngeal erythema present. No  oropharyngeal exudate.  Eyes: Conjunctivae, EOM and lids are normal. Pupils are equal, round, and reactive to light. No scleral icterus.  Neck: Normal range of motion and full passive range of motion without pain. Neck supple. No spinous process tenderness and no muscular tenderness present. No mass and no thyromegaly present.  Cardiovascular: Normal rate, regular rhythm, S1 normal, S2 normal, normal heart sounds and normal pulses.   No extrasystoles are present. PMI is not displaced.  Exam reveals no gallop and no friction rub.   No murmur heard. Pulmonary/Chest: Effort normal and breath sounds normal. No respiratory distress. Right breast exhibits no inverted nipple, no mass, no nipple discharge, no skin change and no tenderness. Left breast exhibits no inverted nipple, no mass, no nipple discharge, no skin change and no tenderness. Breasts are symmetrical.  Mammoplasty scars bilaterally.  Abdominal: Soft. Normal appearance and bowel sounds are normal. She exhibits no distension and no mass. There is no hepatosplenomegaly. There is tenderness in the left lower quadrant. There is no rigidity, no rebound, no guarding and no CVA tenderness.  Genitourinary: Rectum normal, vagina normal and uterus normal. There is no rash, tenderness or lesion on the right labia. There is no rash, tenderness or lesion on the left labia. Cervix exhibits no motion tenderness and no friability. Right adnexum displays no mass, no tenderness and no fullness. Left adnexum displays no mass, no tenderness and no fullness. No erythema, tenderness or bleeding around the vagina. No vaginal discharge found.  Musculoskeletal: Normal range of motion. She exhibits no edema and no tenderness.  Lymphadenopathy:  Head (right side): No submental, no submandibular, no tonsillar, no posterior auricular and no occipital adenopathy present.       Head (left side): No submental, no submandibular, no tonsillar, no posterior auricular and no  occipital adenopathy present.    She has no cervical adenopathy.    She has no axillary adenopathy.       Right: No inguinal and no supraclavicular adenopathy present.       Left: No inguinal and no supraclavicular adenopathy present.  Neurological: She is alert and oriented to person, place, and time. She has normal strength and normal reflexes. She displays no atrophy and no tremor. No cranial nerve deficit or sensory deficit. She exhibits normal muscle tone. Coordination and gait normal.  Skin: Skin is warm, dry and intact. No ecchymosis, no lesion and no rash noted. She is not diaphoretic. No cyanosis. Nails show no clubbing.  Psychiatric: She has a normal mood and affect. Her speech is normal and behavior is normal. Judgment and thought content normal. Cognition and memory are normal.    Results for orders placed in visit on 08/24/13  POCT URINALYSIS DIPSTICK      Result Value Range   Color, UA yellow     Clarity, UA clear     Glucose, UA neg     Bilirubin, UA neg     Ketones, UA neg     Spec Grav, UA >=1.030     Blood, UA neg     pH, UA 5.5     Protein, UA neg     Urobilinogen, UA 0.2     Nitrite, UA neg     Leukocytes, UA Negative        Assessment & Plan:  Routine general medical examination at a health care facility - Plan: POCT urinalysis dipstick, CBC with Differential, Comprehensive metabolic panel, Vit D  25 hydroxy (rtn osteoporosis monitoring), TSH, T3, free, CBC with Differential, Comprehensive metabolic panel, Vit D  25 hydroxy (rtn osteoporosis monitoring), TSH, T3, free  Weight increase - Plan: Amb ref to Medical Nutrition Therapy-MNT  Abdominal pain, left upper quadrant- Suspect abd muscle strain.  Other screening mammogram - Plan: MM Digital Screening  Obesity, unspecified - Plan: Amb ref to Medical Nutrition Therapy-MNT  Meds ordered this encounter  Medications  . diltiazem (TAZTIA XT) 120 MG 24 hr capsule    Sig: Take 1 capsule (120 mg total) by mouth  daily.    Dispense:  90 capsule    Refill:  3

## 2013-08-24 NOTE — Patient Instructions (Signed)
Keeping You Healthy  Get These Tests 1. Blood Pressure- Have your blood pressure checked once a year by your health care provider.  Normal blood pressure is 120/80. 2. Weight- Have your body mass index (BMI) calculated to screen for obesity.  BMI is measure of body fat based on height and weight.  You can also calculate your own BMI at https://www.west-esparza.com/. 3. Cholesterol- Have your cholesterol checked every 5 years starting at age 38 then yearly starting at age 60. 4. Chlamydia, HIV, and other sexually transmitted diseases- Get screened every year until age 39, then within three months of each new sexual provider. 5. Pap Smear- Every 1-3 years; discuss with your health care provider. 6. Mammogram- Every year starting at age 98  Take these medicines  Calcium with Vitamin D-Your body needs 1200 mg of Calcium each day and [phone] IU of Vitamin D daily.  Your body can only absorb 500 mg of Calcium at a time so Calcium must be taken in 2 or 3 divided doses throughout the day.  Multivitamin with folic acid- Once daily if it is possible for you to become pregnant.  Get these Immunizations  Gardasil-Series of three doses; prevents HPV related illness such as genital warts and cervical cancer.  Menactra-Single dose; prevents meningitis.  Tetanus shot- Every 10 years.  Flu shot-Every year. You have Asthma and have been advised that you should have this vaccine every year.  Take these steps 1. Do not smoke-Your healthcare provider can help you quit.  For tips on how to quit go to www.smokefree.gov or call 1-800 QUITNOW. 2. Be physically active- Exercise 5 days a week for at least 30 minutes.  If you are not already physically active, start slow and gradually work up to 30 minutes of moderate physical activity.  Examples of moderate activity include walking briskly, dancing, swimming, bicycling, etc. 3. Breast Cancer- A self breast exam every month is important for early detection of breast  cancer.  For more information and instruction on self breast exams, ask your healthcare provider or [email or web address]. 4. Eat a healthy diet- Eat a variety of healthy foods such as fruits, vegetables, whole grains, low fat milk, low fat cheeses, yogurt, lean meats, poultry and fish, beans, nuts, tofu, etc.  For more information go to www. Thenutritionsource.org 5. Drink alcohol in moderation- Limit alcohol intake to one drink or less per day. Never drink and drive. 6. Depression- Your emotional health is as important as your physical health.  If you're feeling down or losing interest in things you normally enjoy please talk to your healthcare provider about being screened for depression. 7. Dental visit- Brush and floss your teeth twice daily; visit your dentist twice a year. 8. Eye doctor- Get an eye exam at least every 2 years. 9. Helmet use- Always wear a helmet when riding a bicycle, motorcycle, rollerblading or skateboarding. 10. Safe sex- If you may be exposed to sexually transmitted infections, use a condom. 11. Seat belts- Seat belts can save your live; always wear one. 12. Smoke/Carbon Monoxide detectors- These detectors need to be installed on the appropriate level of your home. Replace batteries at least once a year. 13. Skin cancer- When out in the sun please cover up and use sunscreen 15 SPF or higher. 14. Violence- If anyone is threatening or hurting you, please tell your healthcare provider.

## 2013-08-25 ENCOUNTER — Other Ambulatory Visit: Payer: Self-pay | Admitting: Family Medicine

## 2013-08-25 LAB — COMPREHENSIVE METABOLIC PANEL
ALT: 20 U/L (ref 0–35)
AST: 19 U/L (ref 0–37)
Albumin: 3.8 g/dL (ref 3.5–5.2)
CO2: 31 mEq/L (ref 19–32)
Calcium: 9.4 mg/dL (ref 8.4–10.5)
Chloride: 99 mEq/L (ref 96–112)
Creat: 0.8 mg/dL (ref 0.50–1.10)
Glucose, Bld: 96 mg/dL (ref 70–99)
Sodium: 140 mEq/L (ref 135–145)
Total Bilirubin: 0.5 mg/dL (ref 0.3–1.2)
Total Protein: 7.4 g/dL (ref 6.0–8.3)

## 2013-08-25 LAB — CBC WITH DIFFERENTIAL/PLATELET
Basophils Relative: 0 % (ref 0–1)
Eosinophils Relative: 2 % (ref 0–5)
Hemoglobin: 13.1 g/dL (ref 12.0–15.0)
Lymphocytes Relative: 34 % (ref 12–46)
Lymphs Abs: 3.1 10*3/uL (ref 0.7–4.0)
MCH: 29.6 pg (ref 26.0–34.0)
MCV: 86 fL (ref 78.0–100.0)
Neutrophils Relative %: 60 % (ref 43–77)
Platelets: 430 10*3/uL — ABNORMAL HIGH (ref 150–400)
RBC: 4.43 MIL/uL (ref 3.87–5.11)
WBC: 9.3 10*3/uL (ref 4.0–10.5)

## 2013-08-26 LAB — TSH: TSH: 2.189 u[IU]/mL (ref 0.350–4.500)

## 2013-08-26 LAB — VITAMIN D 25 HYDROXY (VIT D DEFICIENCY, FRACTURES): Vit D, 25-Hydroxy: 22 ng/mL — ABNORMAL LOW (ref 30–89)

## 2013-08-27 DIAGNOSIS — J45909 Unspecified asthma, uncomplicated: Secondary | ICD-10-CM | POA: Insufficient documentation

## 2013-08-27 NOTE — Progress Notes (Signed)
Quick Note:  Please advise pt regarding following labs... Vitamin D level is below normal; get over- the- counter Vitamin D3 2000 IU and take 1 capsule daily; try to get some sun exposure for 10 minutes most sunny days of the week.  All other lab results look normal.  Copy to pt. ______

## 2013-11-08 ENCOUNTER — Ambulatory Visit: Payer: BC Managed Care – PPO | Admitting: Dietician

## 2014-01-10 ENCOUNTER — Ambulatory Visit: Payer: BC Managed Care – PPO | Admitting: Medical

## 2014-02-21 ENCOUNTER — Other Ambulatory Visit: Payer: Self-pay | Admitting: Physician Assistant

## 2014-03-28 ENCOUNTER — Telehealth: Payer: Self-pay

## 2014-03-28 NOTE — Telephone Encounter (Signed)
Patient states that she tried to get two of her medications refilled and was told she would need an office visit before she could get a refill.  Patient would like to have someone to call her back to help her understand why she needs come and see Dr. Audria NineMcPherson.   Best#: [phone]

## 2014-03-29 NOTE — Telephone Encounter (Signed)
Pt transferred to make an appt with Dr. Patsy Lageropland for follow up.

## 2014-04-04 ENCOUNTER — Encounter: Payer: Self-pay | Admitting: Family Medicine

## 2014-04-04 ENCOUNTER — Ambulatory Visit (INDEPENDENT_AMBULATORY_CARE_PROVIDER_SITE_OTHER): Payer: BC Managed Care – PPO | Admitting: Family Medicine

## 2014-04-04 VITALS — BP 131/78 | HR 78 | Temp 98.9°F | Resp 16 | Ht 63.5 in | Wt 219.0 lb

## 2014-04-04 DIAGNOSIS — R635 Abnormal weight gain: Secondary | ICD-10-CM

## 2014-04-04 DIAGNOSIS — M542 Cervicalgia: Secondary | ICD-10-CM

## 2014-04-04 DIAGNOSIS — I10 Essential (primary) hypertension: Secondary | ICD-10-CM

## 2014-04-04 LAB — BASIC METABOLIC PANEL
BUN: 8 mg/dL (ref 6–23)
CALCIUM: 9.4 mg/dL (ref 8.4–10.5)
CO2: 24 meq/L (ref 19–32)
Chloride: 98 mEq/L (ref 96–112)
Creat: 0.82 mg/dL (ref 0.50–1.10)
Glucose, Bld: 88 mg/dL (ref 70–99)
POTASSIUM: 3.6 meq/L (ref 3.5–5.3)
SODIUM: 134 meq/L — AB (ref 135–145)

## 2014-04-04 LAB — TSH: TSH: 1.835 u[IU]/mL (ref 0.350–4.500)

## 2014-04-04 MED ORDER — DILTIAZEM HCL ER BEADS 120 MG PO CP24: 120.0000 mg | ORAL_CAPSULE | Freq: Every day | ORAL | Status: DC

## 2014-04-04 MED ORDER — HYDROCHLOROTHIAZIDE 25 MG PO TABS: 25.0000 mg | ORAL_TABLET | Freq: Every day | ORAL | Status: DC
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
## 2014-04-04 NOTE — Patient Instructions (Signed)
We will refer you to see neurosurgery about your neck If you like, look into the bariatric program through [facility]- look at the bariatric surgery program and attend one of the basic seminars to find out more.

## 2014-05-09 ENCOUNTER — Telehealth: Payer: Self-pay

## 2014-05-09 NOTE — Telephone Encounter (Signed)
Pt is needing to talk with dr copland about getting a different referral   Best number [phone]

## 2014-05-10 NOTE — Telephone Encounter (Signed)
Spoke to pt, she still has not been scheduled for neuro, facility states they needed office notes.  Office notes were sent 04/05/2014. Advised pt to call facility and if any other information is needed , please let us know.

## 2014-05-12 ENCOUNTER — Telehealth: Payer: Self-pay

## 2014-05-12 NOTE — Telephone Encounter (Signed)
Pt called stating that [facility] told her that in order to schedule an appt with them she needs to have a different scan first -dr colpand please order the needed scan

## 2014-05-13 NOTE — Telephone Encounter (Signed)
Called [phone]- [phone] Lm Chris-with the new patient scheduler to rtn call and advise us which scan needs to be ordered.

## 2014-05-13 NOTE — Telephone Encounter (Signed)
LM at [facility] today.

## 2014-05-16 NOTE — Telephone Encounter (Signed)
Pt had studies done on her neck in 04/22/2013. Spoke to [place]- these would be acceptable to send to the Neurosurgeon for review. Faxed to Chris's attention at [phone]. She will follow up if further imaging is required.

## 2014-06-28 ENCOUNTER — Other Ambulatory Visit: Payer: Self-pay | Admitting: Neurosurgery

## 2014-06-28 DIAGNOSIS — M5020 Other cervical disc displacement, unspecified cervical region: Secondary | ICD-10-CM

## 2014-09-12 ENCOUNTER — Other Ambulatory Visit: Payer: Self-pay | Admitting: Emergency Medicine

## 2014-11-28 ENCOUNTER — Ambulatory Visit (INDEPENDENT_AMBULATORY_CARE_PROVIDER_SITE_OTHER): Payer: BLUE CROSS/BLUE SHIELD | Admitting: Family Medicine

## 2014-11-28 ENCOUNTER — Encounter: Payer: Self-pay | Admitting: Family Medicine

## 2014-11-28 VITALS — BP 140/100 | HR 81 | Temp 98.2°F | Resp 16 | Ht 64.0 in | Wt 217.8 lb

## 2014-11-28 DIAGNOSIS — R519 Headache, unspecified: Secondary | ICD-10-CM

## 2014-11-28 DIAGNOSIS — R51 Headache: Secondary | ICD-10-CM | POA: Diagnosis not present

## 2014-11-28 DIAGNOSIS — I10 Essential (primary) hypertension: Secondary | ICD-10-CM

## 2014-11-28 MED ORDER — SUMATRIPTAN SUCCINATE 50 MG PO TABS
50.0000 mg | ORAL_TABLET | ORAL | Status: DC | PRN
Start: 2014-11-28 — End: 2014-12-15

## 2014-11-28 MED ORDER — DILTIAZEM HCL ER BEADS 240 MG PO CP24: 240.0000 mg | ORAL_CAPSULE | Freq: Every day | ORAL | Status: DC

## 2014-11-28 MED ORDER — CYCLOBENZAPRINE HCL 10 MG PO TABS: 10.0000 mg | ORAL_TABLET | Freq: Three times a day (TID) | ORAL | Status: DC | PRN

## 2014-11-28 NOTE — Patient Instructions (Signed)
Your blood pressure is a bit high- this may be contributing to your headaches. We will try increasing your diltiazem to 240 mg a day; take 2 of the 120s that you have for a few days to be sure that you tolerate this increase Weight loss (though diet and exercise) may help with your headaches  When you have a migraine, try taking an imitrex at the very start of the headache- this may stop the full headache from developing Also, you can use the flexeril as needed for neck and shoulder pain

## 2014-11-28 NOTE — Progress Notes (Signed)
[facility] [address], [place] [address] [phone]  Date:  11/28/2014   Name:  Shannon Martinez   DOB:  10/10/1975   MRN:  [id number]  PCP:  No primary care provider on file.    Chief Complaint: migraines   History of Present Illness:  Shannon Martinez is a 39 y.o. very pleasant female patient who presents with the following:  Here today to discuss "migraine HA."  She first started to have her headaches over the last 10 years or so.  LMP 11/07/14.  She has been to see neurology but was told that she needs an MRI- however she cannot really afford this currently so she did not pursue further neurology care. Her chiropractor thinks that the problem is in her neck; she does often have pain in her shoulders and left arm as well.    She has been seen at integrative therapies and is being treated with massage and other therapies.  This helps but it is expensive.  She has a headache at least once a week and they can last several days.  When she has a "migraine" she will have a severe headache, pressure more in the back of her head.  She will have dizziness, photophobia, no phonophobia, no nausea or vomiting. She does not have aura.  She has noted intermittent numbness in her left arm for about 4 years.  This is thought to be due to muscle spasm or to her neck.     Her allergist has added a nasal saline rinse to her allergy regimen.  She is currently following an altered diet for lent and recently completed a week of fasting during the day.   She takes ibuprofen as needed for headache.  Flexeril did help with her sx, but she cannot take it during the day.  She has also started herbalife recently to try and lose weight.    She does not plan to become pregnant- she is not SA at this time.    Wt Readings from Last 3 Encounters:  11/28/14 217 lb 12.8 oz (98.793 kg)  04/04/14 219 lb (99.338 kg)  08/24/13 213 lb (96.616 kg)   BP Readings from Last 3 Encounters:   11/28/14 150/92  04/04/14 131/78  08/24/13 136/84     Patient Active Problem List   Diagnosis Date Noted  . 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 Migraine variant 07/07/2012  . Obesity, Class I, BMI 30-34.9 07/07/2012  . HTN (hypertension) 07/07/2012  . Left shoulder pain 06/08/2012    Past Medical History  Diagnosis Date  . Allergy   . Asthma     Past Surgical History  Procedure Laterality Date  . Breast surgery      reduction    History  Substance Use Topics  . Smoking status: Never Smoker   . Smokeless tobacco: Not on file  . Alcohol Use: Yes     Comment: social    Family History  Problem Relation Age of Onset  . Cancer Mother   . Hypertension Father   . Seizures Maternal Grandfather   . Heart disease Paternal Grandmother     Allergies  Allergen Reactions  . Latex Anaphylaxis  . Lisinopril Cough  . Penicillins     childhood  . Losartan Rash    Medication list has been reviewed and updated.  Current Outpatient Prescriptions on File Prior to Visit  Medication Sig Dispense Refill  . albuterol (PROVENTIL HFA;VENTOLIN HFA) 108 (90 BASE)  MCG/ACT inhaler Inhale 2 puffs into the lungs every 6 (six) hours as needed for wheezing.    . beclomethasone (QVAR) 40 MCG/ACT inhaler Inhale 2 puffs into the lungs 2 (two) times daily.    Marland Kitchen diltiazem (TAZTIA XT) 120 MG 24 hr capsule Take 1 capsule (120 mg total) by mouth daily. 90 capsule 3  . EPINEPHrine (EPI-PEN) 0.3 mg/0.3 mL DEVI Inject 0.3 mLs (0.3 mg total) into the muscle once. 2 Device 3  . hydrochlorothiazide (HYDRODIURIL) 25 MG tablet Take 1 tablet (25 mg total) by mouth daily. 90 tablet 3  . Multiple Vitamins-Minerals (MULTIVITAMIN WITH MINERALS) tablet Take 1 tablet by mouth daily.    . cyclobenzaprine (FLEXERIL) 10 MG tablet Take 1 tablet (10 mg total) by mouth 3 (three) times daily as needed for muscle spasms. (Patient not taking: Reported on 11/28/2014) 30 tablet 0   No current facility-administered medications  on file prior to visit.    Review of Systems:  As per HPI- otherwise negative.   Physical Examination: Filed Vitals:   11/28/14 1107  BP: 150/92  Pulse: 81  Temp: 98.2 F (36.8 C)  Resp: 16   Filed Vitals:   11/28/14 1107  Height:  (1.626 m)  Weight: 217 lb 12.8 oz (98.793 kg)   Body mass index is 37.37 kg/(m^2). Ideal Body Weight: Weight in (lb) to have BMI = 25: 145.3  GEN: WDWN, NAD, Non-toxic, A & O x 3, obese but OW looks well HEENT: Atraumatic, Normocephalic. Neck supple. No masses, No LAD.  Bilateral TM wnl, oropharynx normal.  PEERL,EOMI.   Ears and Nose: No external deformity. CV: RRR, No M/G/R. No JVD. No thrill. No extra heart sounds. PULM: CTA B, no wheezes, crackles, rhonchi. No retractions. No resp. distress. No accessory muscle use. EXTR: No c/c/e NEURO Normal gait. Normal strength, sensation and DTR all extremities, normal facial movement PSYCH: Normally interactive. Conversant. Not depressed or anxious appearing.  Calm demeanor.  Tight, tender trapezius muscles    Assessment and Plan: Essential hypertension - Plan: diltiazem (TIAZAC) 240 MG 24 hr capsule  Persistent headaches - Plan: cyclobenzaprine (FLEXERIL) 10 MG tablet, SUMAtriptan (IMITREX) 50 MG tablet  Here today to evaluate persistent headaches.  It is not clear from her description if she is truly having migraine.  Several factors could contribute- elevated BP, obesity (?psuedotumor), neck tension.   Will increase her diltizem to better control her BP.   Flexeril as needed for neck pain and spasm Trial of imitrex for more severe HA.  Plan recheck in 3 months Signed Abbe Amsterdam, MD  3/1: decided to refer to opthalmology to rule- out papilledema.  Called and left detailed message so she will expect this referral

## 2014-12-04 ENCOUNTER — Ambulatory Visit (INDEPENDENT_AMBULATORY_CARE_PROVIDER_SITE_OTHER): Payer: BLUE CROSS/BLUE SHIELD | Admitting: Family Medicine

## 2014-12-04 VITALS — BP 140/90 | HR 78 | Temp 97.9°F | Ht 64.0 in | Wt 218.0 lb

## 2014-12-04 DIAGNOSIS — G43001 Migraine without aura, not intractable, with status migrainosus: Secondary | ICD-10-CM | POA: Diagnosis not present

## 2014-12-04 DIAGNOSIS — M436 Torticollis: Secondary | ICD-10-CM | POA: Diagnosis not present

## 2014-12-04 MED ORDER — HYDROCODONE-ACETAMINOPHEN 5-325 MG PO TABS: 1.0000 | ORAL_TABLET | Freq: Four times a day (QID) | ORAL | Status: DC | PRN

## 2014-12-04 MED ORDER — PREDNISONE 20 MG PO TABS: 40.0000 mg | ORAL_TABLET | Freq: Every day | ORAL | Status: DC

## 2014-12-04 MED ORDER — OXYCODONE-ACETAMINOPHEN 7.5-325 MG PO TABS: 1.0000 | ORAL_TABLET | Freq: Three times a day (TID) | ORAL | Status: DC | PRN

## 2014-12-04 NOTE — Patient Instructions (Signed)
2 medicines to consider taking for prevention of migraine Zonisamide and Topamax    Migraine Headache A migraine headache is an intense, throbbing pain on one or both sides of your head. A migraine can last for 30 minutes to several hours. CAUSES  The exact cause of a migraine headache is not always known. However, a migraine may be caused when nerves in the brain become irritated and release chemicals that cause inflammation. This causes pain. Certain things may also trigger migraines, such as:  Alcohol.  Smoking.  Stress.  Menstruation.  Aged cheeses.  Foods or drinks that contain nitrates, glutamate, aspartame, or tyramine.  Lack of sleep.  Chocolate.  Caffeine.  Hunger.  Physical exertion.  Fatigue.  Medicines used to treat chest pain (nitroglycerine), birth control pills, estrogen, and some blood pressure medicines. SIGNS AND SYMPTOMS  Pain on one or both sides of your head.  Pulsating or throbbing pain.  Severe pain that prevents daily activities.  Pain that is aggravated by any physical activity.  Nausea, vomiting, or both.  Dizziness.  Pain with exposure to bright lights, loud noises, or activity.  General sensitivity to bright lights, loud noises, or smells. Before you get a migraine, you may get warning signs that a migraine is coming (aura). An aura may include:  Seeing flashing lights.  Seeing bright spots, halos, or zigzag lines.  Having tunnel vision or blurred vision.  Having feelings of numbness or tingling.  Having trouble talking.  Having muscle weakness. DIAGNOSIS  A migraine headache is often diagnosed based on:  Symptoms.  Physical exam.  A CT scan or MRI of your head. These imaging tests cannot diagnose migraines, but they can help rule out other causes of headaches. TREATMENT Medicines may be given for pain and nausea. Medicines can also be given to help prevent recurrent migraines.  HOME CARE INSTRUCTIONS  Only take  over-the-counter or prescription medicines for pain or discomfort as directed by your health care provider. The use of long-term narcotics is not recommended.  Lie down in a dark, quiet room when you have a migraine.  Keep a journal to find out what may trigger your migraine headaches. For example, write down:  What you eat and drink.  How much sleep you get.  Any change to your diet or medicines.  Limit alcohol consumption.  Quit smoking if you smoke.  Get 7-9 hours of sleep, or as recommended by your health care provider.  Limit stress.  Keep lights dim if bright lights bother you and make your migraines worse. SEEK IMMEDIATE MEDICAL CARE IF:   Your migraine becomes severe.  You have a fever.  You have a stiff neck.  You have vision loss.  You have muscular weakness or loss of muscle control.  You start losing your balance or have trouble walking.  You feel faint or pass out.  You have severe symptoms that are different from your first symptoms. MAKE SURE YOU:   Understand these instructions.  Will watch your condition.  Will get help right away if you are not doing well or get worse. Document Released: 09/16/2005 Document Revised: 01/31/2014 Document Reviewed: 05/24/2013 [facility] Patient Information 2015 [employer] [place]. This information is not intended to replace advice given to you by your health care provider. Make sure you discuss any questions you have with your health care provider.

## 2014-12-04 NOTE — Progress Notes (Signed)
° °  Subjective:    Patient ID: Shannon Martinez, female    DOB: 05/24/1976, 39 y.o.   MRN: [id number] This chart was scribed for Elvina SidleKurt Lauenstein, MD by Littie Deedsichard Sun, Medical Scribe. This patient was seen in Room 12 and the patient's care was started at 9:13 AM.   HPI HPI Comments: Shannon Martinez is a 39 y.o. female with a hx of HTN and obesity who [facility] for [facility] follow-up. She saw Dr. Patsy Lageropland 6 days ago for migraine HA due to tension in her neck. Patient was put on Imitrex prn for this. Now, her HA has improved, but her neck pain has worsened. She reports having severe, throbbing neck pain and states that her neck is tightening and has spasms. She also reports having associated numbness and soreness in her left upper arm/shoulder. Patient has tried several treatments, including ibuprofen, flexeril, TENS unit, Aleve, tiger balm, and a massage - none of these have been able to provide relief. There is pain with ROM of her neck.   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  Review of Systems  Musculoskeletal: Positive for myalgias and neck pain.  Neurological: Positive for numbness and headaches.       Objective:   Physical Exam CONSTITUTIONAL: Well developed/well nourished HEAD: Normocephalic/atraumatic EYES: EOM/PERRL ENMT: Mucous membranes moist NECK: Pain moving her neck. Tender at the base of her left neck. CV: S1/S2 noted, no murmurs/rubs/gallops noted LUNGS: Lungs are clear to auscultation bilaterally, no apparent distress ABDOMEN: soft, nontender, no rebound or guarding GU: no cva tenderness NEURO: Pt is awake/alert, moves all extremitiesx4. Reflexes normal. EXTREMITIES: Pain moving left arm. SKIN: warm, color normal PSYCH: no abnormalities of mood noted       Assessment & Plan:   This chart was scribed in my presence and reviewed by me personally.    ICD-9-CM ICD-10-CM   1. Torticollis,  acute 723.5 M43.6 predniSONE (DELTASONE) 20 MG tablet     HYDROcodone-acetaminophen (NORCO) 5-325 MG per tablet     DISCONTINUED: oxyCODONE-acetaminophen (PERCOCET) 7.5-325 MG per tablet  2. Migraine without aura and with status migrainosus, not intractable 346.12 G43.001    Consider zonisamide or topamax for recurring migraine  Signed, Elvina SidleKurt Lauenstein, MD

## 2014-12-15 ENCOUNTER — Emergency Department (HOSPITAL_BASED_OUTPATIENT_CLINIC_OR_DEPARTMENT_OTHER)
Admission: EM | Admit: 2014-12-15 | Discharge: 2014-12-15 | Disposition: A | Discharge status: Home / Self Care | Payer: BLUE CROSS/BLUE SHIELD | Attending: Emergency Medicine | Admitting: Emergency Medicine

## 2014-12-15 ENCOUNTER — Encounter (HOSPITAL_BASED_OUTPATIENT_CLINIC_OR_DEPARTMENT_OTHER): Payer: Self-pay

## 2014-12-15 ENCOUNTER — Other Ambulatory Visit: Payer: Self-pay | Admitting: Pulmonary Disease

## 2014-12-15 DIAGNOSIS — J45909 Unspecified asthma, uncomplicated: Secondary | ICD-10-CM | POA: Diagnosis not present

## 2014-12-15 DIAGNOSIS — I10 Essential (primary) hypertension: Secondary | ICD-10-CM | POA: Insufficient documentation

## 2014-12-15 DIAGNOSIS — Z79899 Other long term (current) drug therapy: Secondary | ICD-10-CM | POA: Diagnosis not present

## 2014-12-15 DIAGNOSIS — M25512 Pain in left shoulder: Secondary | ICD-10-CM | POA: Diagnosis not present

## 2014-12-15 DIAGNOSIS — M436 Torticollis: Secondary | ICD-10-CM

## 2014-12-15 DIAGNOSIS — Z7951 Long term (current) use of inhaled steroids: Secondary | ICD-10-CM | POA: Insufficient documentation

## 2014-12-15 DIAGNOSIS — Z91040 Latex allergy status: Secondary | ICD-10-CM | POA: Insufficient documentation

## 2014-12-15 DIAGNOSIS — Z7952 Long term (current) use of systemic steroids: Secondary | ICD-10-CM | POA: Diagnosis not present

## 2014-12-15 DIAGNOSIS — Z880 Allergy status to penicillin: Secondary | ICD-10-CM | POA: Insufficient documentation

## 2014-12-15 HISTORY — DX: Essential (primary) hypertension: I10

## 2014-12-15 MED ORDER — HYDROCODONE-ACETAMINOPHEN 5-325 MG PO TABS: 1.0000 | ORAL_TABLET | Freq: Four times a day (QID) | ORAL | Status: DC | PRN

## 2014-12-15 MED ORDER — HYDROMORPHONE HCL 1 MG/ML IJ SOLN
2.0000 mg | Freq: Once | INTRAMUSCULAR | Status: AC
Start: 2014-12-15 — End: 2014-12-15
  Administered 2014-12-15: 2 mg via INTRAMUSCULAR
  Filled 2014-12-15: qty 2

## 2014-12-15 MED ORDER — ONDANSETRON 8 MG PO TBDP
8.0000 mg | ORAL_TABLET | Freq: Once | ORAL | Status: AC
  Administered 2014-12-15: 8 mg via ORAL
  Filled 2014-12-15: qty 1

## 2014-12-15 NOTE — ED Provider Notes (Addendum)
CSN: [id number]     Arrival date & time 12/15/14  0433 History   First MD Initiated Contact with Patient 12/15/14 0500     Chief Complaint  Patient presents with  . Shoulder Pain     (Consider location/radiation/quality/duration/timing/severity/associated sxs/prior Treatment) HPI  This is a 39 year old female with a long-standing history of cervical radiculopathy with pain radiating to her left upper extremity, worse with movement of the neck. She was treated for this on March 6 with Vicodin and prednisone. Her neck pain has subsequently resolved. She continues to have pain in her left shoulder. She states it feels like it is down in the joint and the pain does not change with movement of the neck. She is also having pain in the soft tissues of the left upper arm. The pain radiates down the posterior aspect of her left arm. There is no associated paresthesias or weakness. She rates the pain as severe, especially if she attempts to move her left shoulder. Range of motion of the left shoulder is thus limited. She denies any injury. The pain is severe enough to prevent her from sleeping.  Past Medical History  Diagnosis Date  . Allergy   . Asthma   . Hypertension    Past Surgical History  Procedure Laterality Date  . Breast surgery      reduction   Family History  Problem Relation Age of Onset  . Cancer Mother   . Hypertension Father   . Seizures Maternal Grandfather   . Heart disease Paternal Grandmother    History  Substance Use Topics  . Smoking status: Never Smoker   . Smokeless tobacco: Not on file  . Alcohol Use: 0.0 oz/week    0 Standard drinks or equivalent per week     Comment: social   OB History    No data available     Review of Systems  All other systems reviewed and are negative.   Allergies  Latex; Lisinopril; Oxycodone; Penicillins; and Losartan  Home Medications   Prior to Admission medications   Medication Sig Start Date End Date Taking? Authorizing  Provider  albuterol (PROVENTIL HFA;VENTOLIN HFA) 108 (90 BASE) MCG/ACT inhaler Inhale 2 puffs into the lungs every 6 (six) hours as needed for wheezing.    Historical Provider, MD  beclomethasone (QVAR) 40 MCG/ACT inhaler Inhale 2 puffs into the lungs 2 (two) times daily.    Historical Provider, MD  BIOTIN 5000 PO Take by mouth.    Historical Provider, MD  cyclobenzaprine (FLEXERIL) 10 MG tablet Take 1 tablet (10 mg total) by mouth 3 (three) times daily as needed for muscle spasms. 11/28/14   Gwenlyn FoundJessica C Copland, MD  diltiazem (TIAZAC) 240 MG 24 hr capsule Take 1 capsule (240 mg total) by mouth daily. 11/28/14   Gwenlyn FoundJessica C Copland, MD  EPINEPHrine (EPI-PEN) 0.3 mg/0.3 mL DEVI Inject 0.3 mLs (0.3 mg total) into the muscle once. 01/08/12   Collene GobbleSteven A Daub, MD  hydrochlorothiazide (HYDRODIURIL) 25 MG tablet Take 1 tablet (25 mg total) by mouth daily. 04/04/14   Pearline CablesJessica C Copland, MD  HYDROcodone-acetaminophen (NORCO) 5-325 MG per tablet Take 1 tablet by mouth every 6 (six) hours as needed for moderate pain. 12/04/14   Elvina SidleKurt Lauenstein, MD  Multiple Vitamins-Minerals (MULTIVITAMIN WITH MINERALS) tablet Take 1 tablet by mouth daily.    Historical Provider, MD  predniSONE (DELTASONE) 20 MG tablet Take 2 tablets (40 mg total) by mouth daily. 12/04/14   Elvina SidleKurt Lauenstein, MD  SUMAtriptan (IMITREX) 50  MG tablet Take 1 tablet (50 mg total) by mouth every 2 (two) hours as needed for migraine. Max 100 mg daily Patient not taking: Reported on 12/04/2014 11/28/14   Gwenlyn Found Copland, MD   BP 147/96 mmHg  Pulse 106  Temp(Src) 98.3 F (36.8 C) (Oral)  Resp 22  SpO2 99%  LMP 12/15/2014   Physical Exam  General: Well-developed, well-nourished female in no acute distress; appearance consistent with age of record HENT: normocephalic; atraumatic Eyes: pupils equal, round and reactive to light; extraocular muscles intact Neck: supple Heart: regular rate and rhythm Lungs: clear to auscultation bilaterally Abdomen: soft;  nondistended Extremities: No deformity; pulses normal; tenderness and pain on attempted range of motion of left shoulder, left upper extremity distally neurovascularly intact Neurologic: Awake, alert and oriented; motor function intact in all extremities and symmetric; sensation intact and symmetric in upper extremities; no facial droop Skin: Warm and dry Psychiatric: Normal mood and affect    ED Course  Procedures (including critical care time)   MDM     Paula Libra, MD 12/15/14 6962  Paula Libra, MD 12/15/14 9528

## 2014-12-15 NOTE — ED Notes (Signed)
Pt c/o lt shoulder pain since 12/01/14, states has been treated for cervical neck/should/arm pain, shoulder pain worse tonight unable to lay down; denies injury

## 2014-12-16 ENCOUNTER — Ambulatory Visit (INDEPENDENT_AMBULATORY_CARE_PROVIDER_SITE_OTHER): Payer: BLUE CROSS/BLUE SHIELD

## 2014-12-16 ENCOUNTER — Ambulatory Visit (INDEPENDENT_AMBULATORY_CARE_PROVIDER_SITE_OTHER): Payer: BLUE CROSS/BLUE SHIELD | Admitting: Family Medicine

## 2014-12-16 VITALS — BP 120/90 | HR 96 | Temp 98.5°F | Resp 16 | Ht 64.0 in | Wt 214.0 lb

## 2014-12-16 DIAGNOSIS — M25512 Pain in left shoulder: Secondary | ICD-10-CM

## 2014-12-16 NOTE — Patient Instructions (Signed)
Use the sling as needed, and I will get your MRI set up asap If you have any other problems in the meantime please let us know Remember to maintain your shoulder range of motion!  Go ahead and schedule an appt to see Dr. Thamas JaegersLennon for as soon as you can Please request a disc of your MRI to take to your orthopedist

## 2014-12-16 NOTE — Progress Notes (Signed)
[facility] [address], [place] [phone]  Date:  12/16/2014   Name:  Shannon Martinez   DOB:  06/29/1976   MRN:  [id number]  PCP:  No primary care provider on file.    Chief Complaint: Shoulder Pain   History of Present Illness:  Shannon Martinez is a 39 y.o. very pleasant female patient who presents with the following:  She was here on 3/6 with neck and shoulder pain.  She took prednisone but ended up in the ER on 3/17 with continued pain.  At that time the pain was actually more in her left shoulder.  She was given a shot and did feel better for a little while.  She was supposed to have an MRI- ordered by the ED- but it turns out her insurance company needs more information prior to approving the study.  She notes no particular injury to her shoulder.  She has a hard time moving the shoulder due to pain.  No recent plain films of her neck or shoulder She is using hydrocodone per the ED for her pain LMP 3/13  Patient Active Problem List   Diagnosis Date Noted  . 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 Migraine variant 07/07/2012  . Obesity, Class I, BMI 30-34.9 07/07/2012  . HTN (hypertension) 07/07/2012  . Left shoulder pain 06/08/2012    Past Medical History  Diagnosis Date  . Allergy   . Asthma   . Hypertension     Past Surgical History  Procedure Laterality Date  . Breast surgery      reduction    History  Substance Use Topics  . Smoking status: Never Smoker   . Smokeless tobacco: Not on file  . Alcohol Use: 0.0 oz/week    0 Standard drinks or equivalent per week     Comment: social    Family History  Problem Relation Age of Onset  . Cancer Mother   . Hypertension Father   . Seizures Maternal Grandfather   . Heart disease Paternal Grandmother     Allergies  Allergen Reactions  . Latex Anaphylaxis  . Lisinopril Cough  . Oxycodone Other (See Comments)    Makes her loopy   . Penicillins     childhood  . Losartan  Rash    Medication list has been reviewed and updated.  Current Outpatient Prescriptions on File Prior to Visit  Medication Sig Dispense Refill  . albuterol (PROVENTIL HFA;VENTOLIN HFA) 108 (90 BASE) MCG/ACT inhaler Inhale 2 puffs into the lungs every 6 (six) hours as needed for wheezing.    . beclomethasone (QVAR) 40 MCG/ACT inhaler Inhale 2 puffs into the lungs 2 (two) times daily.    Marland Kitchen. BIOTIN 5000 PO Take by mouth.    . cyclobenzaprine (FLEXERIL) 10 MG tablet Take 1 tablet (10 mg total) by mouth 3 (three) times daily as needed for muscle spasms. 30 tablet 0  . diltiazem (TIAZAC) 240 MG 24 hr capsule Take 1 capsule (240 mg total) by mouth daily. 90 capsule 3  . EPINEPHrine (EPI-PEN) 0.3 mg/0.3 mL DEVI Inject 0.3 mLs (0.3 mg total) into the muscle once. 2 Device 3  . hydrochlorothiazide (HYDRODIURIL) 25 MG tablet Take 1 tablet (25 mg total) by mouth daily. 90 tablet 3  . HYDROcodone-acetaminophen (NORCO) 5-325 MG per tablet Take 1-2 tablets by mouth every 6 (six) hours as needed (for pain). 20 tablet 0  . Multiple Vitamins-Minerals (MULTIVITAMIN WITH MINERALS) tablet Take 1 tablet by  mouth daily.    . [DISCONTINUED] SUMAtriptan (IMITREX) 50 MG tablet Take 1 tablet (50 mg total) by mouth every 2 (two) hours as needed for migraine. Max 100 mg daily (Patient not taking: Reported on 12/04/2014) 10 tablet 0   No current facility-administered medications on file prior to visit.    Review of Systems:  As per HPI- otherwise negative.   Physical Examination: Filed Vitals:   12/16/14 1347  BP: 120/90  Pulse: 101  Temp: 98.5 F (36.9 C)  Resp: 16   Filed Vitals:   12/16/14 1347  Height:  (1.626 m)  Weight: 214 lb (97.07 kg)   Body mass index is 36.72 kg/(m^2). Ideal Body Weight: Weight in (lb) to have BMI = 25: 145.3   GEN: WDWN, NAD, Non-toxic, A & O x 3, overweight, looks well but holding left arm close to her body HEENT: Atraumatic, Normocephalic. Neck supple. No masses, No  LAD. Ears and Nose: No external deformity. CV: RRR, No M/G/R. No JVD. No thrill. No extra heart sounds. PULM: CTA B, no wheezes, crackles, rhonchi. No retractions. No resp. distress. No accessory muscle use. ABD: S, NT, ND, +BS. No rebound. No HSM. EXTR: No c/c/e NEURO Normal gait.  PSYCH: Normally interactive. Conversant. Not depressed or anxious appearing.  Calm demeanor.  Cervical spine: no bony or significant muscular tenderness, normal ROM, negative Spurlings test She is very tender over the anterior left shoulder.  Tender but less so over the lateral deltoid.   Able to flex and abduct to about 10 degrees, could not evaluate internal and external rotation.  No rash or skin lesion, shoulder is located.  Arm is NV intact.  Normal strength of biceps and triceps, normal elbow ROM  [facility] reading (PRIMARY) by  Dr. Patsy Lager. Left shoulder: negative Cervical spine: minimal to no degenerative change   LEFT SHOULDER - 2+ VIEW  COMPARISON: None  FINDINGS: AC joint alignment normal.  Osseous mineralization normal.  No acute fracture, dislocation, or bone destruction.  Visualized ribs unremarkable.  IMPRESSION: No acute abnormalities.   CERVICAL SPINE 4+ VIEWS  COMPARISON: 02/16/2012  FINDINGS: Straightening of the normal cervical lordosis. No fracture or spondylolisthesis. Mild loss of disc height at C5-C6 with minor loss of disc height at C4-C5. No other degenerative change. Soft tissues are unremarkable.  IMPRESSION: No fracture or acute finding. Minor degenerative changes at C4-C5-C5-C6.  Her ortho is at [facility]- Dr. Thamas Jaegers at the [facility] office- she will contact him  Assessment and Plan: Left shoulder pain - Plan: DG Shoulder Left, DG Cervical Spine Complete, MR Shoulder Left Wo Contrast  Here today with left shoulder pain that has been present for a couple of weeks, getting worse.  She was referred for an MRI per the ED but needs this re-done for insurance reasons.  Will do  this for her today. Given a sling for comfort but reminded to maintain her ROM with exercises several times a day.  Demonstrated pendulum swing and wall climb exercises for her Advised that she will likely need ortho care, she will schedule with her doctor   Signed Abbe Amsterdam, MD

## 2014-12-17 ENCOUNTER — Ambulatory Visit (HOSPITAL_BASED_OUTPATIENT_CLINIC_OR_DEPARTMENT_OTHER): Admission: RE | Admit: 2014-12-17 | Payer: BLUE CROSS/BLUE SHIELD | Source: Ambulatory Visit

## 2014-12-22 ENCOUNTER — Other Ambulatory Visit: Payer: Self-pay

## 2014-12-22 DIAGNOSIS — M25512 Pain in left shoulder: Secondary | ICD-10-CM

## 2015-03-06 ENCOUNTER — Ambulatory Visit: Payer: BLUE CROSS/BLUE SHIELD | Admitting: Family Medicine

## 2015-03-13 ENCOUNTER — Ambulatory Visit: Payer: BLUE CROSS/BLUE SHIELD | Admitting: Family Medicine

## 2015-05-09 ENCOUNTER — Other Ambulatory Visit: Payer: Self-pay | Admitting: Family Medicine

## 2015-07-06 ENCOUNTER — Ambulatory Visit (INDEPENDENT_AMBULATORY_CARE_PROVIDER_SITE_OTHER): Payer: BLUE CROSS/BLUE SHIELD | Admitting: Emergency Medicine

## 2015-07-06 VITALS — BP 142/90 | HR 76 | Temp 98.6°F | Resp 16 | Ht 64.0 in | Wt 218.0 lb

## 2015-07-06 DIAGNOSIS — I10 Essential (primary) hypertension: Secondary | ICD-10-CM | POA: Diagnosis not present

## 2015-07-06 MED ORDER — DILTIAZEM HCL ER BEADS 120 MG PO CP24: 120.0000 mg | ORAL_CAPSULE | Freq: Every day | ORAL | Status: DC

## 2015-07-06 NOTE — Progress Notes (Signed)
Subjective:  Patient ID: Shannon Martinez, female    DOB: January 19, 1976  Age: 39 y.o. MRN: [id number]  CC: Medication Refill   HPI Shannon Martinez presents  for refill of her medication. She's had a long-standing history of hypertension and has been without her medication for 3 weeks. She has no chest pain tightness heaviness pressure no shortness of breath or wheezing no peripheral edema. She has no other active complaint.  History Shannon Martinez has a past medical history of Allergy; Asthma; and Hypertension.   She has past surgical history that includes Breast surgery.   Her  family history includes Cancer in her mother; Heart disease in her paternal grandmother; Hypertension in her father; Seizures in her maternal grandfather.  She   reports that she has never smoked. She does not have any smokeless tobacco history on file. She reports that she drinks alcohol. She reports that she does not use illicit drugs.  Outpatient Prescriptions Prior to Visit  Medication Sig Dispense Refill  . albuterol (PROVENTIL HFA;VENTOLIN HFA) 108 (90 BASE) MCG/ACT inhaler Inhale 2 puffs into the lungs every 6 (six) hours as needed for wheezing.    . beclomethasone (QVAR) 40 MCG/ACT inhaler Inhale 2 puffs into the lungs 2 (two) times daily.    Marland Kitchen BIOTIN 5000 PO Take by mouth.    . EPINEPHrine (EPI-PEN) 0.3 mg/0.3 mL DEVI Inject 0.3 mLs (0.3 mg total) into the muscle once. 2 Device 3  . Multiple Vitamins-Minerals (MULTIVITAMIN WITH MINERALS) tablet Take 1 tablet by mouth daily.    Marland Kitchen diltiazem (TAZTIA XT) 120 MG 24 hr capsule Take 1 capsule (120 mg total) by mouth daily. PATIENT NEEDS AN OFFICE VISIT FOR ADDITIONAL REFILLS. 30 capsule 0  . diltiazem (TIAZAC) 240 MG 24 hr capsule Take 1 capsule (240 mg total) by mouth daily. 90 capsule 3  . hydrochlorothiazide (HYDRODIURIL) 25 MG tablet Take 1 tablet (25 mg total) by mouth daily. PATIENT NEEDS AN OFFICE VISIT FOR ADDITIONAL REFILLS. 30 tablet 0  . cyclobenzaprine  (FLEXERIL) 10 MG tablet Take 1 tablet (10 mg total) by mouth 3 (three) times daily as needed for muscle spasms. (Patient not taking: Reported on 07/06/2015) 30 tablet 0  . HYDROcodone-acetaminophen (NORCO) 5-325 MG per tablet Take 1-2 tablets by mouth every 6 (six) hours as needed (for pain). (Patient not taking: Reported on 07/06/2015) 20 tablet 0   No facility-administered medications prior to visit.    Social History   Social History  . Marital Status: Married    Spouse Name: N/A  . Number of Children: N/A  . Years of Education: N/A   Social History Main Topics  . Smoking status: Never Smoker   . Smokeless tobacco: None  . Alcohol Use: 0.0 oz/week    0 Standard drinks or equivalent per week     Comment: social  . Drug Use: No  . Sexual Activity: Yes   Other Topics Concern  . None   Social History Narrative     Review of Systems  Constitutional: Negative for fever, chills and appetite change.  HENT: Negative for congestion, ear pain, postnasal drip, sinus pressure and sore throat.   Eyes: Negative for pain and redness.  Respiratory: Negative for cough, shortness of breath and wheezing.   Cardiovascular: Negative for leg swelling.  Gastrointestinal: Negative for nausea, vomiting, abdominal pain, diarrhea, constipation and blood in stool.  Endocrine: Negative for polyuria.  Genitourinary: Negative for dysuria, urgency, frequency and flank pain.  Musculoskeletal: Negative for gait problem.  Skin: Negative for rash.  Neurological: Negative for weakness and headaches.  Psychiatric/Behavioral: Negative for confusion and decreased concentration. The patient is not nervous/anxious.     Objective:  BP 142/90 mmHg  Pulse 76  Temp(Src) 98.6 F (37 C) (Oral)  Resp 16  Ht  (1.626 m)  Wt 218 lb (98.884 kg)  BMI 37.40 kg/m2  SpO2 98%  LMP 06/02/2015  Physical Exam  Constitutional: She is oriented to person, place, and time. She appears well-developed and  well-nourished.  HENT:  Head: Normocephalic and atraumatic.  Eyes: Conjunctivae are normal. Pupils are equal, round, and reactive to light.  Pulmonary/Chest: Effort normal.  Musculoskeletal: She exhibits no edema.  Neurological: She is alert and oriented to person, place, and time.  Skin: Skin is dry.  Psychiatric: She has a normal mood and affect. Her behavior is normal. Thought content normal.      Assessment & Plan:   Shannon Martinez was seen today for medication refill.  Diagnoses and all orders for this visit:  Essential hypertension -     Comprehensive metabolic panel -     Lipid panel  Other orders -     Discontinue: diltiazem (TAZTIA XT) 120 MG 24 hr capsule; Take 1 capsule (120 mg total) by mouth daily. PATIENT NEEDS AN OFFICE VISIT FOR ADDITIONAL REFILLS. -     diltiazem (TAZTIA XT) 120 MG 24 hr capsule; Take 1 capsule (120 mg total) by mouth daily.   I have discontinued [name] diltiazem, hydrochlorothiazide, and diltiazem. I have also changed her diltiazem. Additionally, I am having her maintain her EPINEPHrine, multivitamin with minerals, albuterol, beclomethasone, BIOTIN 5000 PO, cyclobenzaprine, and HYDROcodone-acetaminophen.  Meds ordered this encounter  Medications  . DISCONTD: diltiazem (TAZTIA XT) 120 MG 24 hr capsule    Sig: Take 1 capsule (120 mg total) by mouth daily. PATIENT NEEDS AN OFFICE VISIT FOR ADDITIONAL REFILLS.    Dispense:  30 capsule    Refill:  0  . diltiazem (TAZTIA XT) 120 MG 24 hr capsule    Sig: Take 1 capsule (120 mg total) by mouth daily.    Dispense:  90 capsule    Refill:  3   It would appear based on her blood pressure today that she is probably overmedicated with the diltiazem and hydrochlorothiazide I suggested she decrease her diltiazem 220 daily and stop the hydrochlorothiazide and asked her to come back while she is taking the medication so to reevaluate treatment  Appropriate red flag conditions were discussed with the patient  as well as actions that should be taken.  Patient expressed his understanding.  Follow-up: Return in about 4 weeks (around 08/03/2015).  Carmelina Dane, MD

## 2015-07-06 NOTE — Patient Instructions (Signed)
Hypertension Hypertension, commonly called high blood pressure, is when the force of blood pumping through your arteries is too strong. Your arteries are the blood vessels that carry blood from your heart throughout your body. A blood pressure reading consists of a higher number over a lower number, such as 110/72. The higher number (systolic) is the pressure inside your arteries when your heart pumps. The lower number (diastolic) is the pressure inside your arteries when your heart relaxes. Ideally you want your blood pressure below 120/80. Hypertension forces your heart to work harder to pump blood. Your arteries may become narrow or stiff. Having untreated or uncontrolled hypertension can cause heart attack, stroke, kidney disease, and other problems. RISK FACTORS Some risk factors for high blood pressure are controllable. Others are not.  Risk factors you cannot control include:   Race. You may be at higher risk if you are African American.  Age. Risk increases with age.  Gender. Men are at higher risk than women before age 45 years. After age 65, women are at higher risk than men. Risk factors you can control include:  Not getting enough exercise or physical activity.  Being overweight.  Getting too much fat, sugar, calories, or salt in your diet.  Drinking too much alcohol. SIGNS AND SYMPTOMS Hypertension does not usually cause signs or symptoms. Extremely high blood pressure (hypertensive crisis) may cause headache, anxiety, shortness of breath, and nosebleed. DIAGNOSIS To check if you have hypertension, your health care provider will measure your blood pressure while you are seated, with your arm held at the level of your heart. It should be measured at least twice using the same arm. Certain conditions can cause a difference in blood pressure between your right and left arms. A blood pressure reading that is higher than normal on one occasion does not mean that you need treatment. If  it is not clear whether you have high blood pressure, you may be asked to return on a different day to have your blood pressure checked again. Or, you may be asked to monitor your blood pressure at home for 1 or more weeks. TREATMENT Treating high blood pressure includes making lifestyle changes and possibly taking medicine. Living a healthy lifestyle can help lower high blood pressure. You may need to change some of your habits. Lifestyle changes may include:  Following the DASH diet. This diet is high in fruits, vegetables, and whole grains. It is low in salt, red meat, and added sugars.  Keep your sodium intake below 2,300 mg per day.  Getting at least 30-45 minutes of aerobic exercise at least 4 times per week.  Losing weight if necessary.  Not smoking.  Limiting alcoholic beverages.  Learning ways to reduce stress. Your health care provider may prescribe medicine if lifestyle changes are not enough to get your blood pressure under control, and if one of the following is true:  You are 18-59 years of age and your systolic blood pressure is above 140.  You are 60 years of age or older, and your systolic blood pressure is above 150.  Your diastolic blood pressure is above 90.  You have diabetes, and your systolic blood pressure is over 140 or your diastolic blood pressure is over 90.  You have kidney disease and your blood pressure is above 140/90.  You have heart disease and your blood pressure is above 140/90. Your personal target blood pressure may vary depending on your medical conditions, your age, and other factors. HOME CARE INSTRUCTIONS    Have your blood pressure rechecked as directed by your health care provider.   Take medicines only as directed by your health care provider. Follow the directions carefully. Blood pressure medicines must be taken as prescribed. The medicine does not work as well when you skip doses. Skipping doses also puts you at risk for  problems.  Do not smoke.   Monitor your blood pressure at home as directed by your health care provider. SEEK MEDICAL CARE IF:   You think you are having a reaction to medicines taken.  You have recurrent headaches or feel dizzy.  You have swelling in your ankles.  You have trouble with your vision. SEEK IMMEDIATE MEDICAL CARE IF:  You develop a severe headache or confusion.  You have unusual weakness, numbness, or feel faint.  You have severe chest or abdominal pain.  You vomit repeatedly.  You have trouble breathing. MAKE SURE YOU:   Understand these instructions.  Will watch your condition.  Will get help right away if you are not doing well or get worse.   This information is not intended to replace advice given to you by your health care provider. Make sure you discuss any questions you have with your health care provider.   Document Released: 09/16/2005 Document Revised: 01/31/2015 Document Reviewed: 07/09/2013 Elsevier Interactive Patient Education 2016 Elsevier Inc.  

## 2015-07-07 LAB — LIPID PANEL
Cholesterol: 149 mg/dL (ref 125–200)
HDL: 40 mg/dL — ABNORMAL LOW (ref >=46)
LDL CALC: 80 mg/dL (ref <130)
Total CHOL/HDL Ratio: 3.7 Ratio (ref <=5.0)
Triglycerides: 143 mg/dL (ref <150)
VLDL: 29 mg/dL (ref <30)

## 2015-07-07 LAB — COMPREHENSIVE METABOLIC PANEL
ALBUMIN: 3.9 g/dL (ref 3.6–5.1)
ALT: 17 U/L (ref 6–29)
AST: 16 U/L (ref 10–30)
Alkaline Phosphatase: 67 U/L (ref 33–115)
BILIRUBIN TOTAL: 0.4 mg/dL (ref 0.2–1.2)
BUN: 10 mg/dL (ref 7–25)
CO2: 29 mmol/L (ref 20–31)
CREATININE: 0.84 mg/dL (ref 0.50–1.10)
Calcium: 9.4 mg/dL (ref 8.6–10.2)
Chloride: 98 mmol/L (ref 98–110)
Glucose, Bld: 100 mg/dL — ABNORMAL HIGH (ref 65–99)
Potassium: 3.5 mmol/L (ref 3.5–5.3)
SODIUM: 139 mmol/L (ref 135–146)
Total Protein: 7.2 g/dL (ref 6.1–8.1)

## 2015-07-19 ENCOUNTER — Encounter: Payer: Self-pay | Admitting: Family Medicine

## 2015-07-19 ENCOUNTER — Ambulatory Visit (INDEPENDENT_AMBULATORY_CARE_PROVIDER_SITE_OTHER): Payer: BLUE CROSS/BLUE SHIELD | Admitting: Family Medicine

## 2015-07-19 VITALS — BP 150/97 | HR 73 | Temp 98.2°F | Resp 16 | Wt 218.0 lb

## 2015-07-19 DIAGNOSIS — N926 Irregular menstruation, unspecified: Secondary | ICD-10-CM

## 2015-07-19 DIAGNOSIS — N910 Primary amenorrhea: Secondary | ICD-10-CM

## 2015-07-19 DIAGNOSIS — I10 Essential (primary) hypertension: Secondary | ICD-10-CM | POA: Diagnosis not present

## 2015-07-19 LAB — POCT URINE PREGNANCY: Preg Test, Ur: NEGATIVE

## 2015-07-19 MED ORDER — HYDROCHLOROTHIAZIDE 25 MG PO TABS: 25.0000 mg | ORAL_TABLET | Freq: Every day | ORAL | Status: DC

## 2015-07-19 MED ORDER — DILTIAZEM HCL ER BEADS 120 MG PO CP24: 120.0000 mg | ORAL_CAPSULE | Freq: Every day | ORAL | Status: DC

## 2015-07-19 NOTE — Progress Notes (Signed)
[facility] [address], [place] [id number] [phone]  Date:  07/19/2015   Name:  Shannon LevelVeronica B Martinez   DOB:  04/17/1976   MRN:  [id number]  PCP:  No primary care provider on file.    Chief Complaint: Medication Refill   History of Present Illness:  Shannon Martinez is a 39 y.o. very pleasant female patient who presents with the following:  She was here about 2 weeks ago and noted that her BP was 142/90 without medication.  She was given an rx for her BP but did not fill it as it was a 30 day supply and her insurnance only covers 90 day supplies. She did not feel comfortable with her visit that day so came in to see me She has noted some HA, no CP.  Feels that she does need to get back on her BP medications She states that her LMP was in early September- does not suspect pregnancy but cannot completely rule it out.  She is not on ace or arb  Results for orders placed or performed in visit on 07/19/15  POCT urine pregnancy  Result Value Ref Range   Preg Test, Ur Negative Negative     BP Readings from Last 3 Encounters:  07/19/15 150/97  07/06/15 142/90  12/16/14 120/90     Patient Active Problem List   Diagnosis Date Noted  . 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 Migraine variant 07/07/2012  . Obesity, Class I, BMI 30-34.9 07/07/2012  . HTN (hypertension) 07/07/2012  . Left shoulder pain 06/08/2012    Past Medical History  Diagnosis Date  . Allergy   . Asthma   . Hypertension     Past Surgical History  Procedure Laterality Date  . Breast surgery      reduction    Social History  Substance Use Topics  . Smoking status: Never Smoker   . Smokeless tobacco: None  . Alcohol Use: 0.0 oz/week    0 Standard drinks or equivalent per week     Comment: social    Family History  Problem Relation Age of Onset  . Cancer Mother   . Hypertension Father   . Seizures Maternal Grandfather   . Heart disease Paternal Grandmother     Allergies   Allergen Reactions  . Latex Anaphylaxis  . Lisinopril Cough  . Oxycodone Other (See Comments)    Makes her loopy   . Penicillins     childhood  . Losartan Rash    Medication list has been reviewed and updated.  Current Outpatient Prescriptions on File Prior to Visit  Medication Sig Dispense Refill  . albuterol (PROVENTIL HFA;VENTOLIN HFA) 108 (90 BASE) MCG/ACT inhaler Inhale 2 puffs into the lungs every 6 (six) hours as needed for wheezing.    . beclomethasone (QVAR) 40 MCG/ACT inhaler Inhale 2 puffs into the lungs 2 (two) times daily.    . cyclobenzaprine (FLEXERIL) 10 MG tablet Take 1 tablet (10 mg total) by mouth 3 (three) times daily as needed for muscle spasms. 30 tablet 0  . diltiazem (TAZTIA XT) 120 MG 24 hr capsule Take 1 capsule (120 mg total) by mouth daily. 90 capsule 3  . EPINEPHrine (EPI-PEN) 0.3 mg/0.3 mL DEVI Inject 0.3 mLs (0.3 mg total) into the muscle once. 2 Device 3  . HYDROcodone-acetaminophen (NORCO) 5-325 MG per tablet Take 1-2 tablets by mouth every 6 (six) hours as needed (for pain). 20 tablet 0  . Multiple Vitamins-Minerals (MULTIVITAMIN WITH  MINERALS) tablet Take 1 tablet by mouth daily.    Marland Kitchen BIOTIN 5000 PO Take by mouth.    . [DISCONTINUED] SUMAtriptan (IMITREX) 50 MG tablet Take 1 tablet (50 mg total) by mouth every 2 (two) hours as needed for migraine. Max 100 mg daily (Patient not taking: Reported on 12/04/2014) 10 tablet 0   No current facility-administered medications on file prior to visit.    Review of Systems:  As per HPI- otherwise negative.   Physical Examination: Filed Vitals:   07/19/15 1142  BP: 150/97  Pulse: 73  Temp: 98.2 F (36.8 C)  Resp: 16   Filed Vitals:   07/19/15 1142  Weight: 218 lb (98.884 kg)   Body mass index is 37.4 kg/(m^2). Ideal Body Weight:    GEN: WDWN, NAD, Non-toxic, A & O x 3 HEENT: Atraumatic, Normocephalic. Neck supple. No masses, No LAD. Ears and Nose: No external deformity. CV: RRR, No M/G/R. No  JVD. No thrill. No extra heart sounds. PULM: CTA B, no wheezes, crackles, rhonchi. No retractions. No resp. distress. No accessory muscle use. EXTR: No c/c/e NEURO Normal gait.  PSYCH: Normally interactive. Conversant. Not depressed or anxious appearing.  Calm demeanor.  Overweight, looks well  Assessment and Plan: Essential hypertension - Plan: diltiazem (TAZTIA XT) 120 MG 24 hr capsule, hydrochlorothiazide (HYDRODIURIL) 25 MG tablet  Late menstruation - Plan: POCT urine pregnancy  Refilled her BP medications today Will titrate dose as per pt instructions Recheck 6 months  Signed Abbe Amsterdam, MD

## 2015-07-19 NOTE — Patient Instructions (Signed)
Your blood pressure is high but not terrible today Start back on the diltiazem and see how your BP looks in a week or so.  If you are running 130/85 or less you probably don't need the HCTZ at least for now. If your BP is close to this goal you might try just a 1/2 tablet of the HCTZ and see if this is enough  Check your foods/ shakes for sorbitol- this is a sugar substitute that causes gas in many people  Please see me back in about 6 months- sooner if you have any concerns or if your blood pressure is not getting to goal

## 2015-08-05 ENCOUNTER — Emergency Department (HOSPITAL_COMMUNITY)
Admission: EM | Admit: 2015-08-05 | Discharge: 2015-08-06 | Disposition: A | Discharge status: Home / Self Care | Payer: BLUE CROSS/BLUE SHIELD | Attending: Emergency Medicine | Admitting: Emergency Medicine

## 2015-08-05 ENCOUNTER — Encounter (HOSPITAL_COMMUNITY): Payer: Self-pay | Admitting: *Deleted

## 2015-08-05 DIAGNOSIS — Z79899 Other long term (current) drug therapy: Secondary | ICD-10-CM | POA: Insufficient documentation

## 2015-08-05 DIAGNOSIS — N390 Urinary tract infection, site not specified: Secondary | ICD-10-CM | POA: Diagnosis not present

## 2015-08-05 DIAGNOSIS — Z7951 Long term (current) use of inhaled steroids: Secondary | ICD-10-CM | POA: Diagnosis not present

## 2015-08-05 DIAGNOSIS — J45909 Unspecified asthma, uncomplicated: Secondary | ICD-10-CM | POA: Insufficient documentation

## 2015-08-05 DIAGNOSIS — I10 Essential (primary) hypertension: Secondary | ICD-10-CM | POA: Diagnosis not present

## 2015-08-05 DIAGNOSIS — Z880 Allergy status to penicillin: Secondary | ICD-10-CM | POA: Diagnosis not present

## 2015-08-05 DIAGNOSIS — E876 Hypokalemia: Secondary | ICD-10-CM | POA: Diagnosis not present

## 2015-08-05 DIAGNOSIS — R109 Unspecified abdominal pain: Secondary | ICD-10-CM | POA: Diagnosis present

## 2015-08-05 DIAGNOSIS — Z3202 Encounter for pregnancy test, result negative: Secondary | ICD-10-CM | POA: Diagnosis not present

## 2015-08-05 DIAGNOSIS — R197 Diarrhea, unspecified: Secondary | ICD-10-CM | POA: Insufficient documentation

## 2015-08-05 DIAGNOSIS — Z91040 Latex allergy status: Secondary | ICD-10-CM | POA: Insufficient documentation

## 2015-08-05 LAB — COMPREHENSIVE METABOLIC PANEL
ALK PHOS: 57 U/L (ref 38–126)
ALT: 25 U/L (ref 14–54)
AST: 28 U/L (ref 15–41)
Albumin: 3.3 g/dL — ABNORMAL LOW (ref 3.5–5.0)
Anion gap: 9 (ref 5–15)
BUN: 12 mg/dL (ref 6–20)
CO2: 28 mmol/L (ref 22–32)
Calcium: 8.5 mg/dL — ABNORMAL LOW (ref 8.9–10.3)
Chloride: 100 mmol/L — ABNORMAL LOW (ref 101–111)
Creatinine, Ser: 0.96 mg/dL (ref 0.44–1.00)
Glucose, Bld: 104 mg/dL — ABNORMAL HIGH (ref 65–99)
Potassium: 2.8 mmol/L — ABNORMAL LOW (ref 3.5–5.1)
SODIUM: 137 mmol/L (ref 135–145)
Total Bilirubin: 1.2 mg/dL (ref 0.3–1.2)
Total Protein: 7.3 g/dL (ref 6.5–8.1)

## 2015-08-05 LAB — CBC WITH DIFFERENTIAL/PLATELET
BASOS ABS: 0 10*3/uL (ref 0.0–0.1)
Basophils Relative: 0 %
Eosinophils Absolute: 0 10*3/uL (ref 0.0–0.7)
Eosinophils Relative: 0 %
HEMATOCRIT: 37.9 % (ref 36.0–46.0)
Hemoglobin: 12.7 g/dL (ref 12.0–15.0)
Lymphocytes Relative: 14 %
Lymphs Abs: 1.5 10*3/uL (ref 0.7–4.0)
MCH: 29 pg (ref 26.0–34.0)
MCHC: 33.5 g/dL (ref 30.0–36.0)
MCV: 86.5 fL (ref 78.0–100.0)
Monocytes Absolute: 0.5 10*3/uL (ref 0.1–1.0)
Monocytes Relative: 5 %
NEUTROS ABS: 8.5 10*3/uL — AB (ref 1.7–7.7)
NEUTROS PCT: 81 %
Platelets: 363 10*3/uL (ref 150–400)
RBC: 4.38 MIL/uL (ref 3.87–5.11)
RDW: 13.5 % (ref 11.5–15.5)
WBC: 10.5 10*3/uL (ref 4.0–10.5)

## 2015-08-05 LAB — URINE MICROSCOPIC-ADD ON

## 2015-08-05 LAB — URINALYSIS, ROUTINE W REFLEX MICROSCOPIC
Bilirubin Urine: NEGATIVE
Glucose, UA: NEGATIVE mg/dL
Ketones, ur: NEGATIVE mg/dL
Nitrite: NEGATIVE
Protein, ur: NEGATIVE mg/dL
Specific Gravity, Urine: 1.009 (ref 1.005–1.030)
UROBILINOGEN UA: 0.2 mg/dL (ref 0.0–1.0)
pH: 6 (ref 5.0–8.0)

## 2015-08-05 LAB — PREGNANCY, URINE: PREG TEST UR: NEGATIVE

## 2015-08-05 LAB — LIPASE, BLOOD: LIPASE: 23 U/L (ref 11–51)

## 2015-08-05 MED ORDER — CIPROFLOXACIN HCL 500 MG PO TABS: 500.0000 mg | ORAL_TABLET | Freq: Two times a day (BID) | ORAL | Status: DC

## 2015-08-05 MED ORDER — FENTANYL CITRATE (PF) 100 MCG/2ML IJ SOLN
50.0000 ug | Freq: Once | INTRAMUSCULAR | Status: AC
  Administered 2015-08-05: 50 ug via INTRAVENOUS
  Filled 2015-08-05: qty 2

## 2015-08-05 MED ORDER — POTASSIUM CHLORIDE CRYS ER 20 MEQ PO TBCR
40.0000 meq | EXTENDED_RELEASE_TABLET | Freq: Once | ORAL | Status: AC
  Administered 2015-08-05: 40 meq via ORAL
  Filled 2015-08-05: qty 2

## 2015-08-05 MED ORDER — SODIUM CHLORIDE 0.9 % IV BOLUS (SEPSIS)
1000.0000 mL | Freq: Once | INTRAVENOUS | Status: AC
  Administered 2015-08-05: 1000 mL via INTRAVENOUS

## 2015-08-05 MED ORDER — CIPROFLOXACIN IN D5W 400 MG/200ML IV SOLN
400.0000 mg | Freq: Once | INTRAVENOUS | Status: AC
  Administered 2015-08-05: 400 mg via INTRAVENOUS
  Filled 2015-08-05: qty 200

## 2015-08-05 MED ORDER — TRAMADOL HCL 50 MG PO TABS: 50.0000 mg | ORAL_TABLET | Freq: Four times a day (QID) | ORAL | Status: DC | PRN

## 2015-08-05 MED ORDER — ONDANSETRON HCL 4 MG/2ML IJ SOLN
4.0000 mg | Freq: Once | INTRAMUSCULAR | Status: AC
  Administered 2015-08-05: 4 mg via INTRAVENOUS
  Filled 2015-08-05: qty 2

## 2015-08-05 MED ORDER — POTASSIUM CHLORIDE CRYS ER 20 MEQ PO TBCR
20.0000 meq | EXTENDED_RELEASE_TABLET | Freq: Two times a day (BID) | ORAL | Status: DC
Start: 2015-08-05 — End: 2016-10-07

## 2015-08-05 MED ORDER — SODIUM CHLORIDE 0.9 % IV BOLUS (SEPSIS)
500.0000 mL | Freq: Once | INTRAVENOUS | Status: AC
  Administered 2015-08-05: 500 mL via INTRAVENOUS

## 2015-08-05 NOTE — ED Notes (Signed)
Unable to collect labs. Nurse will collect

## 2015-08-05 NOTE — ED Notes (Addendum)
EMS called to [facility] urgent care because patient was said to be hypotensive and tachycardic. Patient has been having nausea vomiting and diarrhea x 3 days. EMS started IV in route and gave Zofran 4mg  IV. EMS reports patient BP was stable when arrived at facility. Patient actively having diarrhe

## 2015-08-05 NOTE — ED Notes (Signed)
Bed: WA04 Expected date: 08/05/15 Expected time: 3:43 PM Means of arrival: Ambulance Comments: Abd pain from urgent care

## 2015-08-05 NOTE — Discharge Instructions (Signed)
You have a urinary tract infection and low potassium. Prescription for antibiotic, potassium, pain medication. Follow-up your primary care doctor. Clear liquids for the next 12 hours.

## 2015-08-06 MED ORDER — FLUCONAZOLE 200 MG PO TABS: 200.0000 mg | ORAL_TABLET | Freq: Every day | ORAL | Status: AC

## 2015-08-06 NOTE — ED Notes (Signed)
Signature pad malfunction. Patient verbalizes understanding of discharge instructions.

## 2015-08-06 NOTE — ED Provider Notes (Signed)
CSN: [id number]     Arrival date & time 08/05/15  1552 History   First MD Initiated Contact with Patient 08/05/15 1612     Chief Complaint  Patient presents with  . Abdominal Pain    N/V/D     (Consider location/radiation/quality/duration/timing/severity/associated sxs/prior Treatment) Patient is a 39 y.o. female presenting with abdominal pain.  Abdominal Pain .... Nausea, vomiting, diarrhea since Thursday. Patient has been unable to keep any fluids down. She also complains of suprapubic tenderness. No fever, sweats, chills, flank pain.  Patient transferred from urgent care center.  Severity of symptoms is moderate. Nothing makes symptoms better or worse.     Past Medical History  Diagnosis Date  . Allergy   . Asthma   . Hypertension    Past Surgical History  Procedure Laterality Date  . Breast surgery      reduction   Family History  Problem Relation Age of Onset  . Cancer Mother   . Hypertension Father   . Seizures Maternal Grandfather   . Heart disease Paternal Grandmother    Social History  Substance Use Topics  . Smoking status: Never Smoker   . Smokeless tobacco: None  . Alcohol Use: 0.0 oz/week    0 Standard drinks or equivalent per week     Comment: social   OB History    No data available     Review of Systems  Gastrointestinal: Positive for abdominal pain.  All other systems reviewed and are negative.     Allergies  Latex; Almond oil; Lisinopril; Other; Oxycodone; Penicillins; and Losartan  Home Medications   Prior to Admission medications   Medication Sig Start Date End Date Taking? Authorizing Provider  acetaminophen (TYLENOL) 500 MG tablet Take 1,000 mg by mouth every 6 (six) hours as needed for moderate pain or headache.   Yes Historical Provider, MD  albuterol (PROVENTIL HFA;VENTOLIN HFA) 108 (90 BASE) MCG/ACT inhaler Inhale 2 puffs into the lungs every 6 (six) hours as needed for wheezing.   Yes Historical Provider, MD  beclomethasone  (QVAR) 40 MCG/ACT inhaler Inhale 2 puffs into the lungs 2 (two) times daily.   Yes Historical Provider, MD  cyclobenzaprine (FLEXERIL) 10 MG tablet Take 1 tablet (10 mg total) by mouth 3 (three) times daily as needed for muscle spasms. 11/28/14  Yes Gwenlyn Found Copland, MD  diltiazem (TAZTIA XT) 120 MG 24 hr capsule Take 1 capsule (120 mg total) by mouth daily. 07/19/15  Yes Gwenlyn Found Copland, MD  EPINEPHrine (EPI-PEN) 0.3 mg/0.3 mL DEVI Inject 0.3 mLs (0.3 mg total) into the muscle once. 01/08/12  Yes Collene Gobble, MD  fluticasone (FLONASE) 50 MCG/ACT nasal spray Place 2 sprays into both nostrils daily as needed for allergies or rhinitis.   Yes Historical Provider, MD  hydrochlorothiazide (HYDRODIURIL) 25 MG tablet Take 1 tablet (25 mg total) by mouth daily. 07/19/15  Yes Gwenlyn Found Copland, MD  ibuprofen (ADVIL,MOTRIN) 200 MG tablet Take 200 mg by mouth every 6 (six) hours as needed for headache or moderate pain.   Yes Historical Provider, MD  loperamide (IMODIUM) 2 MG capsule Take 2-4 mg by mouth as needed for diarrhea or loose stools.   Yes Historical Provider, MD  Multiple Vitamins-Minerals (MULTIVITAMIN WITH MINERALS) tablet Take 1 tablet by mouth daily.   Yes Historical Provider, MD  OVER THE COUNTER MEDICATION Take 1 Dose by mouth daily. Herbal life shakes   Yes Historical Provider, MD  ciprofloxacin (CIPRO) 500 MG tablet Take 1 tablet (500  mg total) by mouth 2 (two) times daily. 08/05/15   Donnetta HutchingBrian Rocquel Askren, MD  fluconazole (DIFLUCAN) 200 MG tablet Take 1 tablet (200 mg total) by mouth daily. 08/06/15 08/13/15  Donnetta HutchingBrian Ziare Cryder, MD  HYDROcodone-acetaminophen (NORCO) 5-325 MG per tablet Take 1-2 tablets by mouth every 6 (six) hours as needed (for pain). 12/15/14   John Molpus, MD  potassium chloride SA (K-DUR,KLOR-CON) 20 MEQ tablet Take 1 tablet (20 mEq total) by mouth 2 (two) times daily. 08/05/15   Donnetta HutchingBrian Arianna Haydon, MD  traMADol (ULTRAM) 50 MG tablet Take 1 tablet (50 mg total) by mouth every 6 (six) hours as needed.  08/05/15   Donnetta HutchingBrian Stacy Sailer, MD   BP 145/78 mmHg  Pulse 97  Temp(Src) 98.7 F (37.1 C) (Oral)  Resp 20  SpO2 97%  LMP 06/02/2015 Physical Exam  Constitutional: She is oriented to person, place, and time.  Looks slightly dehydrated, vital signs normal.  HENT:  Head: Normocephalic and atraumatic.  Eyes: Conjunctivae and EOM are normal. Pupils are equal, round, and reactive to light.  Neck: Normal range of motion. Neck supple.  Cardiovascular: Normal rate and regular rhythm.   Pulmonary/Chest: Effort normal and breath sounds normal.  Abdominal: Soft. Bowel sounds are normal.  Minimal suprapubic tenderness.  Genitourinary:  No flank tenderness  Musculoskeletal: Normal range of motion.  Neurological: She is alert and oriented to person, place, and time.  Skin: Skin is warm and dry.  Psychiatric: She has a normal mood and affect. Her behavior is normal.  Nursing note and vitals reviewed.   ED Course  Procedures (including critical care time) Labs Review Labs Reviewed  COMPREHENSIVE METABOLIC PANEL - Abnormal; Notable for the following:    Potassium 2.8 (*)    Chloride 100 (*)    Glucose, Bld 104 (*)    Calcium 8.5 (*)    Albumin 3.3 (*)    All other components within normal limits  CBC WITH DIFFERENTIAL/PLATELET - Abnormal; Notable for the following:    Neutro Abs 8.5 (*)    All other components within normal limits  URINALYSIS, ROUTINE W REFLEX MICROSCOPIC (NOT AT [facility]) - Abnormal; Notable for the following:    APPearance CLOUDY (*)    Hgb urine dipstick MODERATE (*)    Leukocytes, UA MODERATE (*)    All other components within normal limits  URINE MICROSCOPIC-ADD ON - Abnormal; Notable for the following:    Squamous Epithelial / LPF MANY (*)    Bacteria, UA MANY (*)    All other components within normal limits  URINE CULTURE  LIPASE, BLOOD  PREGNANCY, URINE    Imaging Review No results found. I have personally reviewed and evaluated these images and lab results as part  of my medical decision-making.   EKG Interpretation None      MDM   Final diagnoses:  UTI (lower urinary tract infection)  Hypokalemia    No acute abdomen. Patient feels much better after IV fluids, IV antibiotics for urinary tract infection, oral potassium. Discharge medications Cipro 500 mg, tramadol 50 mg, potassium chloride 20 mEq, Diflucan 200 mg    Donnetta HutchingBrian Akoni Parton, MD 08/06/15 1614

## 2015-08-07 LAB — URINE CULTURE: Special Requests: NORMAL

## 2015-10-14 ENCOUNTER — Ambulatory Visit (INDEPENDENT_AMBULATORY_CARE_PROVIDER_SITE_OTHER): Payer: BLUE CROSS/BLUE SHIELD | Admitting: Family Medicine

## 2015-10-14 VITALS — BP 124/82 | HR 94 | Temp 98.3°F | Resp 18 | Ht 65.0 in | Wt 217.0 lb

## 2015-10-14 DIAGNOSIS — J209 Acute bronchitis, unspecified: Secondary | ICD-10-CM

## 2015-10-14 MED ORDER — HYDROCOD POLST-CPM POLST ER 10-8 MG/5ML PO SUER: 5.0000 mL | Freq: Two times a day (BID) | ORAL | Status: DC | PRN

## 2015-10-14 MED ORDER — FLUTICASONE-SALMETEROL 100-50 MCG/DOSE IN AEPB: 1.0000 | INHALATION_SPRAY | Freq: Two times a day (BID) | RESPIRATORY_TRACT | Status: DC

## 2015-10-14 NOTE — Patient Instructions (Signed)
Asthma Attack Prevention While you may not be able to control the fact that you have asthma, you can take actions to prevent asthma attacks. The best way to prevent asthma attacks is to maintain good control of your asthma. You can achieve this by:  Taking your medicines as directed.  Avoiding things that can irritate your airways or make your asthma symptoms worse (asthma triggers).  Keeping track of how well your asthma is controlled and of any changes in your symptoms.  Responding quickly to worsening asthma symptoms (asthma attack).  Seeking emergency care when it is needed. WHAT ARE SOME WAYS TO PREVENT AN ASTHMA ATTACK? Have a Plan Work with your health care provider to create a written plan for managing and treating your asthma attacks (asthma action plan). This plan includes:  A list of your asthma triggers and how you can avoid them.  Information on when medicines should be taken and when their dosages should be changed.  The use of a device that measures how well your lungs are working (peak flow meter). Monitor Your Asthma Use your peak flow meter and record your results in a journal every day. A drop in your peak flow numbers on one or more days may indicate the start of an asthma attack. This can happen even before you start to feel symptoms. You can prevent an asthma attack from getting worse by following the steps in your asthma action plan. Avoid Asthma Triggers Work with your asthma health care provider to find out what your asthma triggers are. This can be done by:  Allergy testing.  Keeping a journal that notes when asthma attacks occur and the factors that may have contributed to them.  Determining if there are other medical conditions that are making your asthma worse. Once you have determined your asthma triggers, take steps to avoid them. This may include avoiding excessive or prolonged exposure to:  Dust. Have someone dust and vacuum your home for you once or  twice a week. Using a high-efficiency particulate arrestance (HEPA) vacuum is best.  Smoke. This includes campfire smoke, forest fire smoke, and secondhand smoke from tobacco products.  Pet dander. Avoid contact with animals that you know you are allergic to.  Allergens from trees, grasses or pollens. Avoid spending a lot of time outdoors when pollen counts are high, and on very windy days.  Very cold, dry, or humid air.  Mold.  Foods that contain high amounts of sulfites.  Strong odors.  Outdoor air pollutants, such as engine exhaust.  Indoor air pollutants, such as aerosol sprays and fumes from household cleaners.  Household pests, including dust mites and cockroaches, and pest droppings.  Certain medicines, including NSAIDs. Always talk to your health care provider before stopping or starting any new medicines. Medicines Take over-the-counter and prescription medicines only as told by your health care provider. Many asthma attacks can be prevented by carefully following your medicine schedule. Taking your medicines correctly is especially important when you cannot avoid certain asthma triggers. Act Quickly If an asthma attack does happen, acting quickly can decrease how severe it is and how long it lasts. Take these steps:   Pay attention to your symptoms. If you are coughing, wheezing, or having difficulty breathing, do not wait to see if your symptoms go away on their own. Follow your asthma action plan.  If you have followed your asthma action plan and your symptoms are not improving, call your health care provider or seek immediate medical care   at the nearest hospital. It is important to note how often you need to use your fast-acting rescue inhaler. If you are using your rescue inhaler more often, it may mean that your asthma is not under control. Adjusting your asthma treatment plan may help you to prevent future asthma attacks and help you to gain better control of your  condition. HOW CAN I PREVENT AN ASTHMA ATTACK WHEN I EXERCISE? Follow advice from your health care provider about whether you should use your fast-acting inhaler before exercising. Many people with asthma experience exercise-induced bronchoconstriction (EIB). This condition often worsens during vigorous exercise in cold, humid, or dry environments. Usually, people with EIB can stay very active by pre-treating with a fast-acting inhaler before exercising.   This information is not intended to replace advice given to you by your health care provider. Make sure you discuss any questions you have with your health care provider.   Document Released: 09/04/2009 Document Revised: 06/07/2015 Document Reviewed: 02/16/2015 Elsevier Interactive Patient Education 2016 [employer] Inc.  

## 2015-10-14 NOTE — Progress Notes (Signed)
This chart was scribed for Shannon SidleKurt Dondra Rhett, MD by [facility] LLC[facility] Hashem, medical scribe at [facility].The patient was seen in exam room 12 and the patient's care was started at 8:23 AM.  Patient ID: Shannon LevelVeronica B Martinez MRN: [id number], DOB: 10/01/1975, 40 y.o. Date of Encounter: 10/14/2015  Primary Physician: No primary care provider on file.  Chief Complaint:  Chief Complaint  Patient presents with  . Cough    x2 mths no mucous    HPI:  Shannon Martinez is a 40 y.o. female with a history of asthma who presents to [facility] due to a persistent cough that has been ongoing for two months. She is having trouble sleeping due to her cough.  She has taken tessalon perls, her inhaler. Does not want prednisone due to weight gain. A housing compliant officer  Past Medical History  Diagnosis Date  . Allergy   . Asthma   . Hypertension     Home Meds: Prior to Admission medications   Medication Sig Start Date End Date Taking? Authorizing Provider  albuterol (PROVENTIL HFA;VENTOLIN HFA) 108 (90 BASE) MCG/ACT inhaler Inhale 2 puffs into the lungs every 6 (six) hours as needed for wheezing.   Yes Historical Provider, MD  beclomethasone (QVAR) 40 MCG/ACT inhaler Inhale 2 puffs into the lungs 2 (two) times daily.   Yes Historical Provider, MD  diltiazem (TAZTIA XT) 120 MG 24 hr capsule Take 1 capsule (120 mg total) by mouth daily. 07/19/15  Yes Gwenlyn FoundJessica C Copland, MD  EPINEPHrine (EPI-PEN) 0.3 mg/0.3 mL DEVI Inject 0.3 mLs (0.3 mg total) into the muscle once. 01/08/12  Yes Collene GobbleSteven A Daub, MD  hydrochlorothiazide (HYDRODIURIL) 25 MG tablet Take 1 tablet (25 mg total) by mouth daily. 07/19/15  Yes Gwenlyn FoundJessica C Copland, MD  Multiple Vitamins-Minerals (MULTIVITAMIN WITH MINERALS) tablet Take 1 tablet by mouth daily.   Yes Historical Provider, MD  OVER THE COUNTER MEDICATION Take 1 Dose by mouth daily. Herbal life shakes   Yes Historical Provider, MD  potassium chloride SA  (K-DUR,KLOR-CON) 20 MEQ tablet Take 1 tablet (20 mEq total) by mouth 2 (two) times daily. 08/05/15  Yes Donnetta HutchingBrian Cook, MD  acetaminophen (TYLENOL) 500 MG tablet Take 1,000 mg by mouth every 6 (six) hours as needed for moderate pain or headache. Reported on 10/14/2015    Historical Provider, MD  ciprofloxacin (CIPRO) 500 MG tablet Take 1 tablet (500 mg total) by mouth 2 (two) times daily. Patient not taking: Reported on 10/14/2015 08/05/15   Donnetta HutchingBrian Cook, MD  cyclobenzaprine (FLEXERIL) 10 MG tablet Take 1 tablet (10 mg total) by mouth 3 (three) times daily as needed for muscle spasms. Patient not taking: Reported on 10/14/2015 11/28/14   Pearline CablesJessica C Copland, MD  fluticasone (FLONASE) 50 MCG/ACT nasal spray Place 2 sprays into both nostrils daily as needed for allergies or rhinitis. Reported on 10/14/2015    Historical Provider, MD  HYDROcodone-acetaminophen (NORCO) 5-325 MG per tablet Take 1-2 tablets by mouth every 6 (six) hours as needed (for pain). Patient not taking: Reported on 10/14/2015 12/15/14   Paula LibraJohn Molpus, MD  ibuprofen (ADVIL,MOTRIN) 200 MG tablet Take 200 mg by mouth every 6 (six) hours as needed for headache or moderate pain. Reported on 10/14/2015    Historical Provider, MD  loperamide (IMODIUM) 2 MG capsule Take 2-4 mg by mouth as needed for diarrhea or loose stools. Reported on 10/14/2015    Historical Provider, MD  traMADol (ULTRAM) 50 MG tablet Take  1 tablet (50 mg total) by mouth every 6 (six) hours as needed. Patient not taking: Reported on 10/14/2015 08/05/15   Donnetta Hutching, MD   Allergies:  Allergies  Allergen Reactions  . Latex Anaphylaxis  . Almond Oil Other (See Comments)    unknown  . Lisinopril Cough  . Other     Hazelnuts and [place]  . Oxycodone Other (See Comments)    Makes her loopy   . Penicillins     childhood Has patient had a PCN reaction causing immediate rash, facial/tongue/throat swelling, SOB or lightheadedness with hypotension: Has patient had a PCN reaction causing  severe rash involving mucus membranes or skin necrosis: Has patient had a PCN reaction that required hospitalization  Has patient had a PCN reaction occurring within the last 10 years:  If all of the above answers are "NO", then may proceed with Cephalosporin use.    . Losartan Rash   Social History   Social History  . Marital Status: Married    Spouse Name: N/A  . Number of Children: N/A  . Years of Education: N/A   Occupational History  . Not on file.   Social History Main Topics  . Smoking status: Never Smoker   . Smokeless tobacco: Not on file  . Alcohol Use: 0.6 oz/week    1 Standard drinks or equivalent per week     Comment: social  . Drug Use: No  . Sexual Activity: Yes   Other Topics Concern  . Not on file   Social History Narrative    Review of Systems: Constitutional: negative for chills, fever, night sweats, weight changes, or fatigue  HEENT: negative for vision changes, hearing loss, congestion, rhinorrhea, ST, epistaxis, or sinus pressure Cardiovascular: negative for chest pain or palpitations Respiratory: negative for hemoptysis, wheezing, shortness of breath. Positive for a cough. Abdominal: negative for abdominal pain, nausea, vomiting, diarrhea, or constipation Dermatological: negative for rash Neurologic: negative for headache, dizziness, or syncope All other systems reviewed and are otherwise negative with the exception to those above and in the HPI.  Physical Exam: Blood pressure 124/82, pulse 94, temperature 98.3 F (36.8 C), temperature source Oral, resp. rate 18, height 5\' 5"  (1.651 m), weight 217 lb (98.431 kg), last menstrual period 10/04/2015, SpO2 97 %., Body mass index is 36.11 kg/(m^2). General: Well developed, well nourished, in no acute distress. Head: Normocephalic, atraumatic, eyes without discharge, sclera non-icteric, nares are without discharge. Bilateral auditory canals clear, TM's are without perforation, pearly grey and translucent  with reflective cone of light bilaterally. Oral cavity moist, posterior pharynx without exudate, erythema, peritonsillar abscess, or post nasal drip.  Neck: Supple. No thyromegaly. Full ROM. No lymphadenopathy. Lungs: Clear bilaterally to auscultation without wheezes, rales, or rhonchi. Breathing is unlabored. Heart: RRR with S1 S2. No murmurs, rubs, or gallops appreciated. Abdomen: Soft, non-tender, non-distended with normoactive bowel sounds. No hepatomegaly. No rebound/guarding. No obvious abdominal masses. Msk:  Strength and tone normal for age. Extremities/Skin: Warm and dry. No clubbing or cyanosis. No edema. No rashes or suspicious lesions. Neuro: Alert and oriented X 3. Moves all extremities spontaneously. Gait is normal. CNII-XII grossly in tact. Psych:  Responds to questions appropriately with a normal affect.   Labs:  ASSESSMENT AND PLAN:  40 y.o. year old female with asthmatic bronchitis  By signing my name below, I, Nadim Abuhashem, attest that this documentation has been prepared under the direction and in the presence of Shannon Sidle, MD.  Electronically Signed: Conchita Paris, medical scribe.  10/14/2015 8:30 AM.    This chart was scribed in my presence and reviewed by me personally.    ICD-9-CM ICD-10-CM   1. Acute bronchitis, unspecified organism 466.0 J20.9 chlorpheniramine-HYDROcodone (TUSSIONEX PENNKINETIC ER) 10-8 MG/5ML SUER     Fluticasone-Salmeterol (ADVAIR) 100-50 MCG/DOSE AEPB     Signed, Shannon Sidle, MD

## 2015-11-09 ENCOUNTER — Ambulatory Visit (INDEPENDENT_AMBULATORY_CARE_PROVIDER_SITE_OTHER): Payer: BLUE CROSS/BLUE SHIELD | Admitting: Physician Assistant

## 2015-11-09 VITALS — BP 122/80 | HR 83 | Temp 99.2°F | Resp 18 | Ht 64.0 in | Wt 212.0 lb

## 2015-11-09 DIAGNOSIS — R05 Cough: Secondary | ICD-10-CM | POA: Diagnosis not present

## 2015-11-09 DIAGNOSIS — K219 Gastro-esophageal reflux disease without esophagitis: Secondary | ICD-10-CM | POA: Diagnosis not present

## 2015-11-09 DIAGNOSIS — R059 Cough, unspecified: Secondary | ICD-10-CM

## 2015-11-09 DIAGNOSIS — J358 Other chronic diseases of tonsils and adenoids: Secondary | ICD-10-CM

## 2015-11-09 MED ORDER — OMEPRAZOLE 40 MG PO CPDR: 40.0000 mg | DELAYED_RELEASE_CAPSULE | Freq: Every day | ORAL | Status: DC

## 2015-11-09 MED ORDER — HYDROCOD POLST-CPM POLST ER 10-8 MG/5ML PO SUER: 5.0000 mL | Freq: Two times a day (BID) | ORAL | Status: DC | PRN

## 2015-11-09 NOTE — Patient Instructions (Addendum)
Take prilosec once a day in the morning. Take this for 8-12 weeks Return in 1 month if symptoms still not improving. Gargle salt water for your tonsil stone  Food Choices for Gastroesophageal Reflux Disease, Adult When you have gastroesophageal reflux disease (GERD), the foods you eat and your eating habits are very important. Choosing the right foods can help ease the discomfort of GERD. WHAT GENERAL GUIDELINES DO I NEED TO FOLLOW?  Choose fruits, vegetables, whole grains, low-fat dairy products, and low-fat meat, fish, and poultry.  Limit fats such as oils, salad dressings, butter, nuts, and avocado.  Keep a food diary to identify foods that cause symptoms.  Avoid foods that cause reflux. These may be different for different people.  Eat frequent small meals instead of three large meals each day.  Eat your meals slowly, in a relaxed setting.  Limit fried foods.  Cook foods using methods other than frying.  Avoid drinking alcohol.  Avoid drinking large amounts of liquids with your meals.  Avoid bending over or lying down until 2-3 hours after eating. WHAT FOODS ARE NOT RECOMMENDED? The following are some foods and drinks that may worsen your symptoms: Vegetables Tomatoes. Tomato juice. Tomato and spaghetti sauce. Chili peppers. Onion and garlic. Horseradish. Fruits Oranges, grapefruit, and lemon (fruit and juice). Meats High-fat meats, fish, and poultry. This includes hot dogs, ribs, ham, sausage, salami, and bacon. Dairy Whole milk and chocolate milk. Sour cream. Cream. Butter. Ice cream. Cream cheese.  Beverages Coffee and tea, with or without caffeine. Carbonated beverages or energy drinks. Condiments Hot sauce. Barbecue sauce.  Sweets/Desserts Chocolate and cocoa. Donuts. Peppermint and spearmint. Fats and Oils High-fat foods, including [place] fries and potato chips. Other Vinegar. Strong spices, such as black pepper, white pepper, red pepper, cayenne, curry  powder, cloves, ginger, and chili powder. The items listed above may not be a complete list of foods and beverages to avoid. Contact your dietitian for more information.   This information is not intended to replace advice given to you by your health care provider. Make sure you discuss any questions you have with your health care provider.   Document Released: 09/16/2005 Document Revised: 10/07/2014 Document Reviewed: 07/21/2013 Elsevier Interactive Patient Education [employer]

## 2015-11-09 NOTE — Progress Notes (Signed)
[facility] [address] [place] [address] [phone]  Date:  11/09/2015   Name:  Shannon Martinez   DOB:  1976/09/13   MRN:  [id number]  PCP:  No primary care provider on file.    Chief Complaint: Follow-up   History of Present Illness:  This is a 40 y.o. female with PMH asthma, HTN who is presenting with cough x 3 months. Cough is dry. Was seen here 1/14 and treated for bronchitis with tussionex and advair. She states tussionex helps her sleep but otherwise there has been no change in her cough. She states she does not feel bad. No fever or chills. Cough is the worst when she lays down at night. She will occ have a coughing fit during the day as well. She has a history of env allergies which she feels are well controlled on zyrtec and flonase. She does have a history of asthma. No wheezing or sob with her cough. She generally only needs albuterol prior to exercise. Never wakes needing inhaler. She has qvar and advair on hand for prn use, esp during illnesses. She is not using these currently. She feels sometimes when she coughs like she is having acid reflux. She has had acid reflux in the past but does not take anything currently.  She is also complaining of a tonsil stone in her left tonsil. She feels she may need an antibiotic for this.  Review of Systems:  Review of Systems See HPI  Patient Active Problem List   Diagnosis Date Noted  . 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 Migraine variant 07/07/2012  . Obesity, Class I, BMI 30-34.9 07/07/2012  . HTN (hypertension) 07/07/2012  . Left shoulder pain 06/08/2012    Prior to Admission medications   Medication Sig Start Date End Date Taking? Authorizing Provider  acetaminophen (TYLENOL) 500 MG tablet Take 1,000 mg by mouth every 6 (six) hours as needed for moderate pain or headache. Reported on 10/14/2015   Yes Historical Provider, MD  albuterol (PROVENTIL HFA;VENTOLIN HFA) 108 (90 BASE) MCG/ACT inhaler Inhale 2  puffs into the lungs every 6 (six) hours as needed for wheezing.   Yes Historical Provider, MD  beclomethasone (QVAR) 40 MCG/ACT inhaler Inhale 2 puffs into the lungs 2 (two) times daily.   Yes Historical Provider, MD  chlorpheniramine-HYDROcodone (TUSSIONEX PENNKINETIC ER) 10-8 MG/5ML SUER Take 5 mLs by mouth every 12 (twelve) hours as needed for cough. 10/14/15  Yes Elvina Sidle, MD  diltiazem (TAZTIA XT) 120 MG 24 hr capsule Take 1 capsule (120 mg total) by mouth daily. 07/19/15  Yes Gwenlyn Found Copland, MD  EPINEPHrine (EPI-PEN) 0.3 mg/0.3 mL DEVI Inject 0.3 mLs (0.3 mg total) into the muscle once. 01/08/12  Yes Collene Gobble, MD  fluticasone (FLONASE) 50 MCG/ACT nasal spray Place 2 sprays into both nostrils daily as needed for allergies or rhinitis. Reported on 10/14/2015   Yes Historical Provider, MD  Fluticasone-Salmeterol (ADVAIR) 100-50 MCG/DOSE AEPB Inhale 1 puff into the lungs 2 (two) times daily. 10/14/15  Yes Elvina Sidle, MD  hydrochlorothiazide (HYDRODIURIL) 25 MG tablet Take 1 tablet (25 mg total) by mouth daily. 07/19/15  Yes Gwenlyn Found Copland, MD  ibuprofen (ADVIL,MOTRIN) 200 MG tablet Take 200 mg by mouth every 6 (six) hours as needed for headache or moderate pain. Reported on 10/14/2015   Yes Historical Provider, MD  loperamide (IMODIUM) 2 MG capsule Take 2-4 mg by mouth as needed for diarrhea or loose stools. Reported on  10/14/2015   Yes Historical Provider, MD  Multiple Vitamins-Minerals (MULTIVITAMIN WITH MINERALS) tablet Take 1 tablet by mouth daily.   Yes Historical Provider, MD  OVER THE COUNTER MEDICATION Take 1 Dose by mouth daily. Herbal life shakes   Yes Historical Provider, MD  potassium chloride SA (K-DUR,KLOR-CON) 20 MEQ tablet Take 1 tablet (20 mEq total) by mouth 2 (two) times daily. 08/05/15  Yes Donnetta Hutching, MD  traMADol (ULTRAM) 50 MG tablet Take 1 tablet (50 mg total) by mouth every 6 (six) hours as needed. 08/05/15  Yes Donnetta Hutching, MD       Paula Libra, MD     Allergies  Allergen Reactions  . Latex Anaphylaxis  . Almond Oil Other (See Comments)    unknown  . Lisinopril Cough  . Other     Hazelnuts and [place]  . Oxycodone Other (See Comments)    Makes her loopy   . Penicillins     childhood Has patient had a PCN reaction causing immediate rash, facial/tongue/throat swelling, SOB or lightheadedness with hypotension: Has patient had a PCN reaction causing severe rash involving mucus membranes or skin necrosis: Has patient had a PCN reaction that required hospitalization  Has patient had a PCN reaction occurring within the last 10 years:  If all of the above answers are "NO", then may proceed with Cephalosporin use.    . Losartan Rash    Past Surgical History  Procedure Laterality Date  . Breast surgery      reduction    Social History  Substance Use Topics  . Smoking status: Never Smoker   . Smokeless tobacco: None  . Alcohol Use: 0.6 oz/week    1 Standard drinks or equivalent per week     Comment: social    Family History  Problem Relation Age of Onset  . Cancer Mother   . Hypertension Father   . Seizures Maternal Grandfather   . Heart disease Paternal Grandmother     Medication list has been reviewed and updated.  Physical Examination:  Physical Exam  Constitutional: She is oriented to person, place, and time. She appears well-developed and well-nourished. No distress.  HENT:  Head: Normocephalic and atraumatic.  Right Ear: Hearing, tympanic membrane, external ear and ear canal normal.  Left Ear: Hearing, tympanic membrane, external ear and ear canal normal.  Nose: Mucosal edema present. Right sinus exhibits no maxillary sinus tenderness and no frontal sinus tenderness. Left sinus exhibits no maxillary sinus tenderness and no frontal sinus tenderness.  Mouth/Throat: Uvula is midline and mucous membranes are normal.  Large left tonsil stone No erythema  Eyes: Conjunctivae and lids are normal. Right eye  exhibits no discharge. Left eye exhibits no discharge. No scleral icterus.  Cardiovascular: Normal rate, regular rhythm, normal heart sounds and normal pulses.   No murmur heard. Pulmonary/Chest: Effort normal and breath sounds normal. No respiratory distress. She has no decreased breath sounds. She has no wheezes. She has no rhonchi. She has no rales.  Musculoskeletal: Normal range of motion.  Lymphadenopathy:       Head (right side): No submental, no submandibular and no tonsillar adenopathy present.       Head (left side): No submental, no submandibular and no tonsillar adenopathy present.    She has no cervical adenopathy.  Neurological: She is alert and oriented to person, place, and time.  Skin: Skin is warm, dry and intact. No lesion and no rash noted.  Psychiatric: She has a normal mood and affect. Her  speech is normal and behavior is normal. Thought content normal.   BP 122/80 mmHg  Pulse 83  Temp(Src) 99.2 F (37.3 C) (Oral)  Resp 18  Ht 5\' 4"  (1.626 m)  Wt 212 lb (96.163 kg)  BMI 36.37 kg/m2  SpO2 94%  LMP 11/06/2015  Assessment and Plan:  1. Cough 2. GERD Suspect cough related to GERD. Does not seem to be having flare in asthma symptoms and she reports allergies well controlled on zyrtec and flonase. Will treat with omeprazole 40 mg QD. Refilled tussionex to help with sleep. Return in 1 month if symptoms not improved. Continue omeprazole treatment for 8-12 weeks if working. - omeprazole (PRILOSEC) 40 MG capsule; Take 1 capsule (40 mg total) by mouth daily.  Dispense: 30 capsule; Refill: 3 - chlorpheniramine-HYDROcodone (TUSSIONEX PENNKINETIC ER) 10-8 MG/5ML SUER; Take 5 mLs by mouth every 12 (twelve) hours as needed for cough.  Dispense: 100 mL; Refill: 0  3. Tonsil stone Counseled that stone should express spontaneously. Try gargling with salt water.   [place] Brackett, MHS [facility]  11/10/2015

## 2016-07-25 ENCOUNTER — Telehealth: Payer: Self-pay | Admitting: Family Medicine

## 2016-07-25 DIAGNOSIS — I10 Essential (primary) hypertension: Secondary | ICD-10-CM

## 2016-07-26 NOTE — Telephone Encounter (Signed)
Last seen 07/19/15 and you are no longer listed as PCP. Please advise refills.

## 2016-07-26 NOTE — Telephone Encounter (Signed)
Called patient and left message to return call

## 2016-07-31 NOTE — Telephone Encounter (Signed)
Returned call, pt states she does still want to see Dr. Patsy Lageropland. Scheduled CPE for Jan. 2018.

## 2016-07-31 NOTE — Telephone Encounter (Signed)
Patient states that she has never been to our office. However, if you still need to call her she said that is fine too.

## 2016-07-31 NOTE — Telephone Encounter (Signed)
Called patient and left message to return call

## 2016-08-30 IMAGING — CR DG SHOULDER 2+V*L*
2 series · 2 of 2 positions shown · non-contrast
Comparison: None

CLINICAL DATA: Neck and LEFT shoulder pain, no known injury

EXAM:
LEFT SHOULDER - 2+ VIEW

[AP]
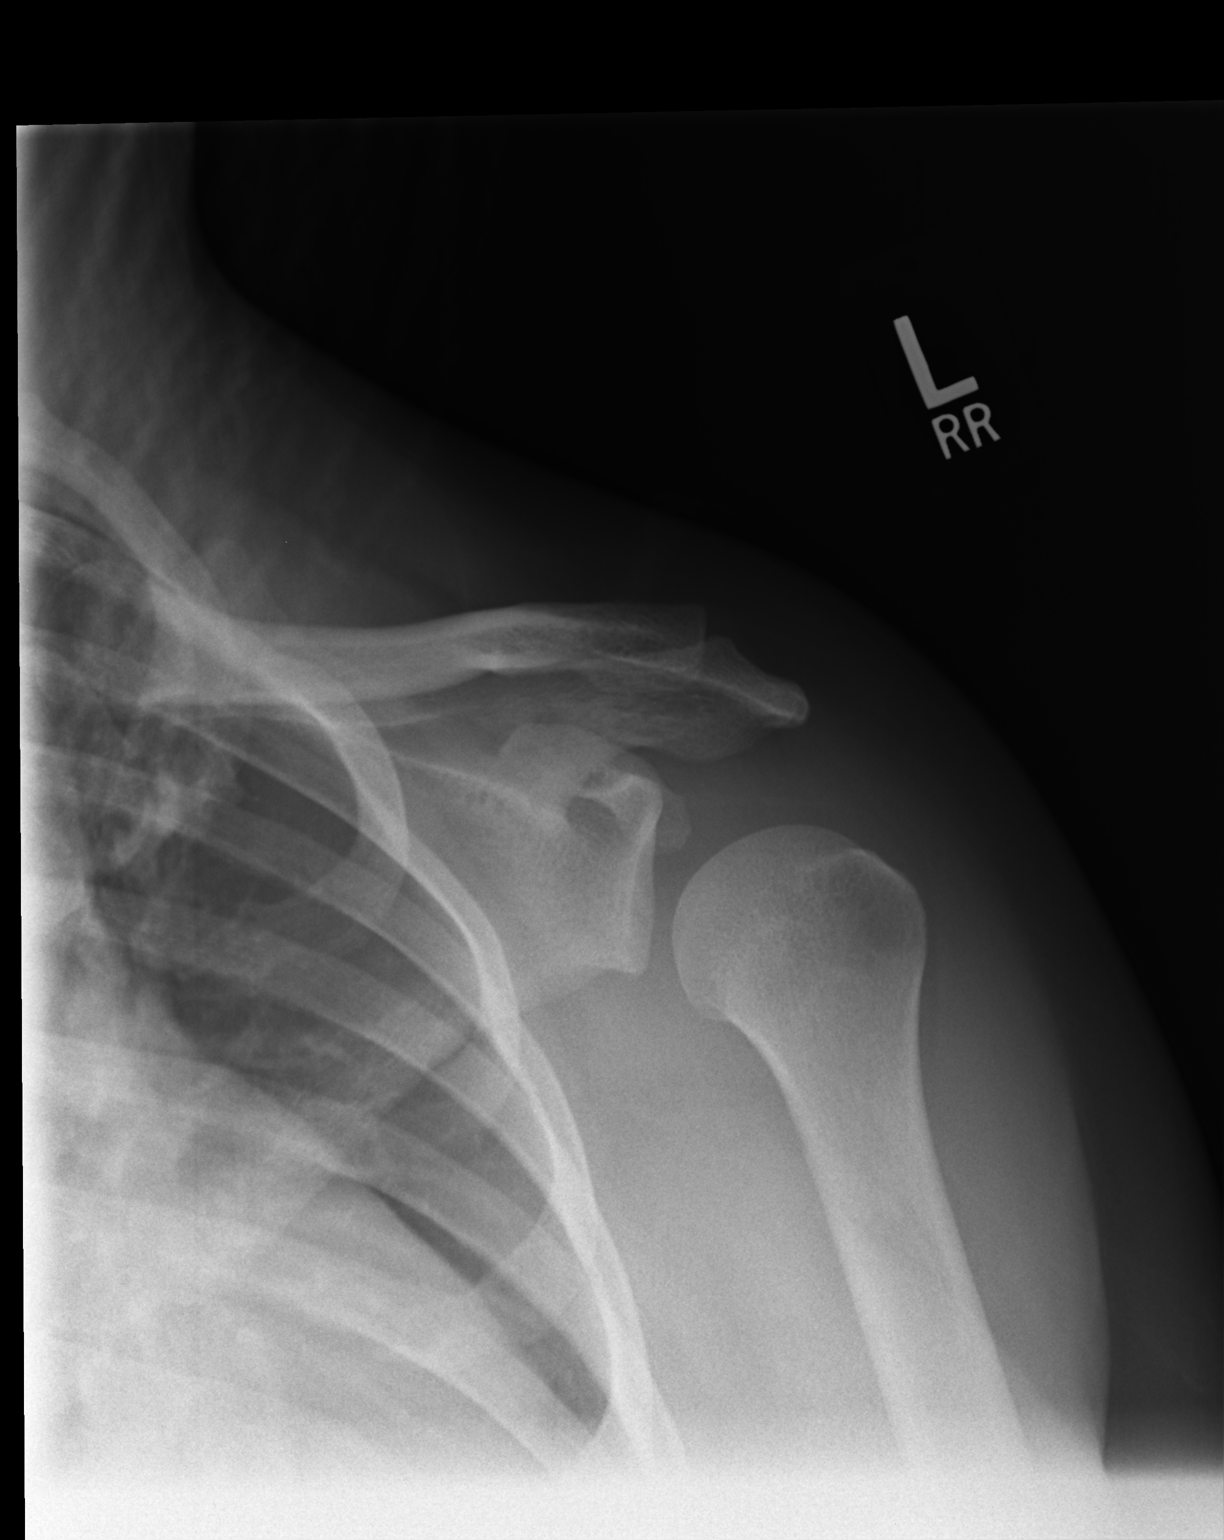

[pa y view]
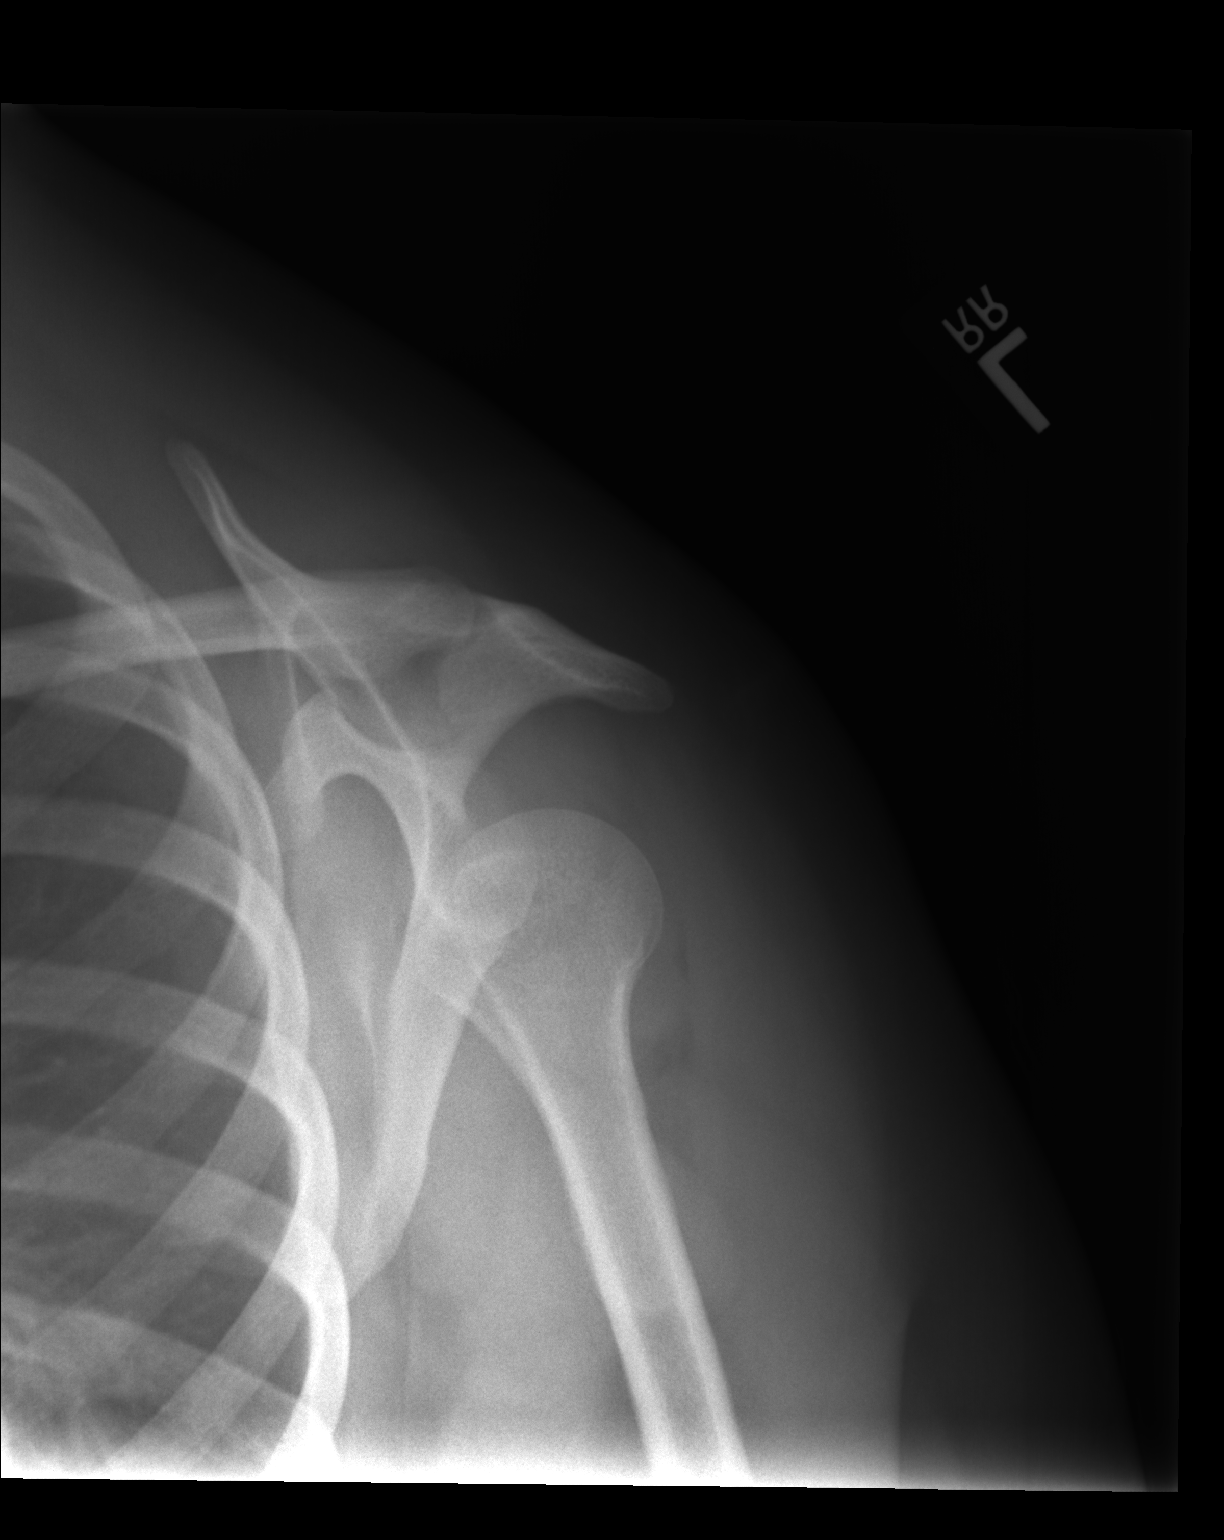

[2 of 2 positions shown; findings below may reference images not displayed]

FINDINGS: AC joint alignment normal.

Osseous mineralization normal.

No acute fracture, dislocation, or bone destruction.

Visualized ribs unremarkable.
IMPRESSION: No acute abnormalities.

## 2016-10-07 ENCOUNTER — Encounter: Payer: Self-pay | Admitting: Family Medicine

## 2016-10-07 ENCOUNTER — Telehealth: Payer: Self-pay | Admitting: Family Medicine

## 2016-10-07 ENCOUNTER — Ambulatory Visit (INDEPENDENT_AMBULATORY_CARE_PROVIDER_SITE_OTHER): Payer: Managed Care, Other (non HMO) | Admitting: Family Medicine

## 2016-10-07 VITALS — BP 122/84 | HR 75 | Temp 98.1°F | Ht 64.0 in | Wt 217.6 lb

## 2016-10-07 DIAGNOSIS — Z131 Encounter for screening for diabetes mellitus: Secondary | ICD-10-CM

## 2016-10-07 DIAGNOSIS — T7840XD Allergy, unspecified, subsequent encounter: Secondary | ICD-10-CM

## 2016-10-07 DIAGNOSIS — Z0000 Encounter for general adult medical examination without abnormal findings: Secondary | ICD-10-CM | POA: Diagnosis not present

## 2016-10-07 DIAGNOSIS — Z1329 Encounter for screening for other suspected endocrine disorder: Secondary | ICD-10-CM

## 2016-10-07 DIAGNOSIS — Z8669 Personal history of other diseases of the nervous system and sense organs: Secondary | ICD-10-CM

## 2016-10-07 DIAGNOSIS — Z130 Encounter for screening for diseases of the blood and blood-forming organs and certain disorders involving the immune mechanism: Secondary | ICD-10-CM | POA: Diagnosis not present

## 2016-10-07 DIAGNOSIS — Z13220 Encounter for screening for lipoid disorders: Secondary | ICD-10-CM | POA: Diagnosis not present

## 2016-10-07 DIAGNOSIS — R7303 Prediabetes: Secondary | ICD-10-CM

## 2016-10-07 DIAGNOSIS — E669 Obesity, unspecified: Secondary | ICD-10-CM

## 2016-10-07 DIAGNOSIS — I10 Essential (primary) hypertension: Secondary | ICD-10-CM

## 2016-10-07 LAB — COMPREHENSIVE METABOLIC PANEL
ALBUMIN: 3.9 g/dL (ref 3.5–5.2)
ALT: 15 U/L (ref 0–35)
AST: 16 U/L (ref 0–37)
Alkaline Phosphatase: 66 U/L (ref 39–117)
BUN: 10 mg/dL (ref 6–23)
CALCIUM: 9.5 mg/dL (ref 8.4–10.5)
CHLORIDE: 98 meq/L (ref 96–112)
CO2: 33 mEq/L — ABNORMAL HIGH (ref 19–32)
CREATININE: 0.85 mg/dL (ref 0.40–1.20)
GFR: 94.4 mL/min (ref >60.00)
Glucose, Bld: 103 mg/dL — ABNORMAL HIGH (ref 70–99)
POTASSIUM: 3.4 meq/L — AB (ref 3.5–5.1)
SODIUM: 138 meq/L (ref 135–145)
TOTAL PROTEIN: 7.9 g/dL (ref 6.0–8.3)
Total Bilirubin: 0.5 mg/dL (ref 0.2–1.2)

## 2016-10-07 LAB — LIPID PANEL
CHOLESTEROL: 168 mg/dL (ref 0–200)
HDL: 52.5 mg/dL (ref >39.00)
LDL Cholesterol: 100 mg/dL — ABNORMAL HIGH (ref 0–99)
NonHDL: 115.66
TRIGLYCERIDES: 76 mg/dL (ref 0.0–149.0)
Total CHOL/HDL Ratio: 3
VLDL: 15.2 mg/dL (ref 0.0–40.0)

## 2016-10-07 LAB — CBC
HEMATOCRIT: 41.2 % (ref 36.0–46.0)
Hemoglobin: 14.2 g/dL (ref 12.0–15.0)
MCHC: 34.5 g/dL (ref 30.0–36.0)
MCV: 85.9 fl (ref 78.0–100.0)
Platelets: 379 10*3/uL (ref 150.0–400.0)
RBC: 4.8 Mil/uL (ref 3.87–5.11)
RDW: 13.9 % (ref 11.5–15.5)
WBC: 9.6 10*3/uL (ref 4.0–10.5)

## 2016-10-07 LAB — HEMOGLOBIN A1C: HEMOGLOBIN A1C: 6.1 % (ref 4.6–6.5)

## 2016-10-07 LAB — TSH: TSH: 4.25 u[IU]/mL (ref 0.35–4.50)

## 2016-10-07 MED ORDER — DILTIAZEM HCL ER BEADS 120 MG PO CP24: 120.0000 mg | ORAL_CAPSULE | Freq: Every day | ORAL | 3 refills | Status: DC

## 2016-10-07 MED ORDER — EPINEPHRINE 0.3 MG/0.3ML IJ SOAJ: 0.3000 mg | Freq: Once | INTRAMUSCULAR | 99 refills | Status: AC

## 2016-10-07 MED ORDER — HYDROCHLOROTHIAZIDE 12.5 MG PO TABS: 12.5000 mg | ORAL_TABLET | Freq: Every day | ORAL | 3 refills | Status: DC

## 2016-10-07 NOTE — Progress Notes (Signed)
Pre visit review using our clinic review tool, if applicable. No additional management support is needed unless otherwise documented below in the visit note. 

## 2016-10-07 NOTE — Progress Notes (Signed)
[facility] [address] [place], [place] [address] [phone]  Date:  10/07/2016   Name:  Shannon Martinez   DOB:  09-Apr-1976   MRN:  [id number]  PCP:  Abbe Amsterdam, MD    Chief Complaint: No chief complaint on file.   History of Present Illness:  Shannon Martinez is a 41 y.o. very pleasant female patient who presents with the following:  History of obesity, HTN, migraine HA, asthma here today for a CPE.  I know this pt from [facility] but this is her first visit to [facility] She is feeling well  She does see OBGYn- Dr. Mindi Slicker Last pap 01/2016 She did have a mammogram in the last year and it was ok  She is on HCTZ for her BP and also taztia xt.  She has noted some possible episodes of hypotension.  Often will occur while seated- she may feel lightheaded- "like the bottom dropped out." wonders if we might be able to decrease her dose of BP medication.  She is able to check her BP at home Her asthma has been under control- she is sensitive to colognes and perfumes as her main trigger She uses the albuterol less than once a month. No longer on any controller medication Her migraines are stable- she is working on identifying her triggers and has come to suspect dairy.  She is also lactose intolerant from a GI standpoint and tries to avoid dairy products  Never a smoker. Rare alcohol She has not been exercising much but plans to restart her water aerobics program- however she is not as excited about this during the winter months. Discussed ways to make water exercise easier even in the cold.  She is fasting today for labs   She did have anaphylaxis to latex in the but has never needed her epipen.  She needs to refill her pen as it is out of date  Weight is stable- she wishes that she could lose but is at least not gaining   Wt Readings from Last 3 Encounters:  10/07/16 217 lb 9.6 oz (98.7 kg)  11/09/15 212 lb (96.2 kg)   10/14/15 217 lb (98.4 kg)     BP Readings from Last 3 Encounters:  10/07/16 122/84  11/09/15 122/80  10/14/15 124/82     Patient Active Problem List   Diagnosis Date Noted  . 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 Migraine variant 07/07/2012  . Obesity, Class I, BMI 30-34.9 07/07/2012  . HTN (hypertension) 07/07/2012  . Left shoulder pain 06/08/2012    Past Medical History:  Diagnosis Date  . Allergy   . Asthma   . Hypertension     Past Surgical History:  Procedure Laterality Date  . BREAST SURGERY     reduction    Social History  Substance Use Topics  . Smoking status: Never Smoker  . Smokeless tobacco: Not on file  . Alcohol use 0.6 oz/week    1 Standard drinks or equivalent per week     Comment: social    Family History  Problem Relation Age of Onset  . Cancer Mother   . Hypertension Father   . Seizures Maternal Grandfather   . Heart disease Paternal Grandmother     Allergies  Allergen Reactions  . Latex Anaphylaxis  . Almond Oil Other (See Comments)    unknown  . Lisinopril Cough  . Other     Hazelnuts and [place]  .  Oxycodone Other (See Comments)    Makes her loopy   . Penicillins     childhood Has patient had a PCN reaction causing immediate rash, facial/tongue/throat swelling, SOB or lightheadedness with hypotension: Has patient had a PCN reaction causing severe rash involving mucus membranes or skin necrosis: Has patient had a PCN reaction that required hospitalization  Has patient had a PCN reaction occurring within the last 10 years:  If all of the above answers are "NO", then may proceed with Cephalosporin use.    . Losartan Rash    Medication list has been reviewed and updated.  Current Outpatient Prescriptions on File Prior to Visit  Medication Sig Dispense Refill  . acetaminophen (TYLENOL) 500 MG tablet Take 1,000 mg by mouth every 6 (six) hours as needed for moderate pain or headache. Reported on 10/14/2015    . albuterol (PROVENTIL  HFA;VENTOLIN HFA) 108 (90 BASE) MCG/ACT inhaler Inhale 2 puffs into the lungs every 6 (six) hours as needed for wheezing.    . beclomethasone (QVAR) 40 MCG/ACT inhaler Inhale 2 puffs into the lungs 2 (two) times daily.    . chlorpheniramine-HYDROcodone (TUSSIONEX PENNKINETIC ER) 10-8 MG/5ML SUER Take 5 mLs by mouth every 12 (twelve) hours as needed for cough. 100 mL 0  . EPINEPHrine (EPI-PEN) 0.3 mg/0.3 mL DEVI Inject 0.3 mLs (0.3 mg total) into the muscle once. 2 Device 3  . fluticasone (FLONASE) 50 MCG/ACT nasal spray Place 2 sprays into both nostrils daily as needed for allergies or rhinitis. Reported on 10/14/2015    . Fluticasone-Salmeterol (ADVAIR) 100-50 MCG/DOSE AEPB Inhale 1 puff into the lungs 2 (two) times daily. 1 each 3  . hydrochlorothiazide (HYDRODIURIL) 25 MG tablet TAKE ONE TABLET BY MOUTH ONCE DAILY 90 tablet 3  . HYDROcodone-acetaminophen (NORCO) 5-325 MG per tablet Take 1-2 tablets by mouth every 6 (six) hours as needed (for pain). (Patient not taking: Reported on 10/14/2015) 20 tablet 0  . ibuprofen (ADVIL,MOTRIN) 200 MG tablet Take 200 mg by mouth every 6 (six) hours as needed for headache or moderate pain. Reported on 10/14/2015    . loperamide (IMODIUM) 2 MG capsule Take 2-4 mg by mouth as needed for diarrhea or loose stools. Reported on 10/14/2015    . Multiple Vitamins-Minerals (MULTIVITAMIN WITH MINERALS) tablet Take 1 tablet by mouth daily.    Marland Kitchen. omeprazole (PRILOSEC) 40 MG capsule Take 1 capsule (40 mg total) by mouth daily. 30 capsule 3  . OVER THE COUNTER MEDICATION Take 1 Dose by mouth daily. Herbal life shakes    . potassium chloride SA (K-DUR,KLOR-CON) 20 MEQ tablet Take 1 tablet (20 mEq total) by mouth 2 (two) times daily. 20 tablet 0  . TAZTIA XT 120 MG 24 hr capsule TAKE ONE CAPSULE BY MOUTH ONCE DAILY 90 capsule 0  . traMADol (ULTRAM) 50 MG tablet Take 1 tablet (50 mg total) by mouth every 6 (six) hours as needed. 15 tablet 0  . [DISCONTINUED] SUMAtriptan (IMITREX) 50  MG tablet Take 1 tablet (50 mg total) by mouth every 2 (two) hours as needed for migraine. Max 100 mg daily (Patient not taking: Reported on 12/04/2014) 10 tablet 0   No current facility-administered medications on file prior to visit.     Review of Systems: No fever, chills, weight change, wheezing, CP, SOB, rash, nausea, vomiting or diarrhea  As per HPI- otherwise negative.   Physical Examination:   Blood pressure 122/84, pulse 75, temperature 98.1 F (36.7 C), temperature source Oral, height 5\' 4"  (1.626  m), weight 217 lb 9.6 oz (98.7 kg), last menstrual period 09/28/2016, SpO2 99 %. Body mass index is 37.35 kg/m.   GEN: WDWN, NAD, Non-toxic, A & O x 3, obese, looks well HEENT: Atraumatic, Normocephalic. Neck supple. No masses, No LAD.  Bilateral TM wnl, oropharynx normal.  PEERL,EOMI.   Ears and Nose: No external deformity. CV: RRR, No M/G/R. No JVD. No thrill. No extra heart sounds. PULM: CTA B, no wheezes, crackles, rhonchi. No retractions. No resp. distress. No accessory muscle use. ABD: S, NT, ND. No rebound. No HSM. EXTR: No c/c/e NEURO Normal gait.  PSYCH: Normally interactive. Conversant. Not depressed or anxious appearing.  Calm demeanor.    Assessment and Plan: Physical exam  Essential hypertension - Plan: diltiazem (TAZTIA XT) 120 MG 24 hr capsule, hydrochlorothiazide (HYDRODIURIL) 12.5 MG tablet  Allergic reaction, subsequent encounter - Plan: EPINEPHrine 0.3 mg/0.3 mL IJ SOAJ injection  History of migraine headaches  Obesity, unspecified classification, unspecified obesity type, unspecified whether serious comorbidity present  Screening for hyperlipidemia - Plan: Lipid panel  Screening for diabetes mellitus - Plan: Comprehensive metabolic panel, Hemoglobin A1c  Screening for thyroid disorder - Plan: TSH  Screening for deficiency anemia - Plan: CBC  Here today for a CPE Declines a flu shot' Discussed her weight, encouraged exercise Await labs for her  today and will be in touch Decreased her HCTZ dose as she has noted some sx of hypotension- she will monitor her home BP and let me know if running too high Refilled epipen as hers is expired We have called [facility] and tried to obtain her last tetanus shot date    Signed Abbe Amsterdam, MD

## 2016-10-07 NOTE — Telephone Encounter (Signed)
[facility] called stating that there is no documentation of patient having a Tdap immunization. Please advise   Phone: [phone]

## 2016-10-07 NOTE — Patient Instructions (Addendum)
It was very nice to see you today!  I will be in touch with your labs asap, and will call [facility] to ask about your last tetanus shot. If you are due I will let you know Your BP looks fine- we will decrease your HCTZ to 12.5 mg as it wounds like you are having some episodes of low blood pressure.  I would encourage you to get back into exercise- a lightweight wet suit or "skin" shirt to keep you warm in the pool may help you get back into water aerobics!   Your weight is stable however which is a good thing.   Please plan to see me in about 6 months unless your labs indicate otherwise.  Also, since we are changing your BP med slightly please check your pressure a few times over the next 2 months; if you are generally running higher than 130/85 please let me know

## 2016-10-08 DIAGNOSIS — R7303 Prediabetes: Secondary | ICD-10-CM | POA: Insufficient documentation

## 2016-10-31 ENCOUNTER — Ambulatory Visit (INDEPENDENT_AMBULATORY_CARE_PROVIDER_SITE_OTHER): Payer: Managed Care, Other (non HMO) | Admitting: Family Medicine

## 2016-10-31 VITALS — BP 120/70 | HR 98 | Temp 99.8°F | Ht 64.0 in | Wt 216.4 lb

## 2016-10-31 DIAGNOSIS — R6889 Other general symptoms and signs: Secondary | ICD-10-CM

## 2016-10-31 MED ORDER — OSELTAMIVIR PHOSPHATE 6 MG/ML PO SUSR: 75.0000 mg | Freq: Two times a day (BID) | ORAL | 0 refills | Status: DC

## 2016-10-31 MED ORDER — OSELTAMIVIR PHOSPHATE 75 MG PO CAPS: 75.0000 mg | ORAL_CAPSULE | Freq: Two times a day (BID) | ORAL | 0 refills | Status: DC

## 2016-10-31 MED ORDER — BENZONATATE 100 MG PO CAPS: 100.0000 mg | ORAL_CAPSULE | Freq: Three times a day (TID) | ORAL | 0 refills | Status: DC | PRN

## 2016-10-31 NOTE — Patient Instructions (Signed)
Only pick up one of the Tamiflu prescriptions. Get whichever is cheaper.  Continue to push fluids, practice good hand hygiene, and cover your mouth if you cough.

## 2016-10-31 NOTE — Progress Notes (Signed)
Chief Complaint  Patient presents with  . Cough    congestion    Darnell LevelVeronica B Martinez here for URI complaints.  Duration: 2 days  Associated symptoms: felt feverish and sweaty yesterday, coughing, runny nose, ST, scratchy throat, myalgias Denies: itchy watery eyes, ear pain, ear drainage, nasal congestion, sinus pain and SOB Treatment to date: honey  Sick contacts: No  ROS:  Const: +fevers HEENT: As noted in HPI Lungs: No SOB  Past Medical History:  Diagnosis Date  . Allergy   . Asthma   . Hypertension    Family History  Problem Relation Age of Onset  . Cancer Mother   . Hypertension Father   . Seizures Maternal Grandfather   . Heart disease Paternal Grandmother     BP 120/70 (BP Location: Left Arm, Patient Position: Sitting, Cuff Size: Large)   Pulse 98   Temp 99.8 F (37.7 C) (Oral)   Ht 5\' 4"  (1.626 m)   Wt 216 lb 6 oz (98.1 kg)   SpO2 94%   BMI 37.14 kg/m  General: Awake, alert, appears stated age HEENT: AT, [place], ears patent b/l and TM's neg, nares patent w/o discharge, no sinus tenderness, pharynx pink and without exudates, MMM Neck: No masses or asymmetry Heart: RRR, no murmurs, no bruits Lungs: CTAB, no accessory muscle use Psych: Age appropriate judgment and insight, normal mood and affect  Flu-like symptoms - Plan: oseltamivir (TAMIFLU) 6 MG/ML SUSR suspension, oseltamivir (TAMIFLU) 75 MG capsule, benzonatate (TESSALON) 100 MG capsule  Orders as above. Solution appeared to be on formulary, ordered both to see which is cheaper. Tessalon Perle's on $4 list. Don't tx fever if feeling well. Letter for work given. Continue to push fluids, practice good hand hygiene, cover mouth when coughing. F/u prn. Pt voiced understanding and agreement to the plan.  Jilda Rocheicholas Paul [place], DO 10/31/16 2:09 PM

## 2016-10-31 NOTE — Progress Notes (Signed)
Pre visit review using our clinic review tool, if applicable. No additional management support is needed unless otherwise documented below in the visit note. 

## 2017-02-03 ENCOUNTER — Ambulatory Visit (INDEPENDENT_AMBULATORY_CARE_PROVIDER_SITE_OTHER): Payer: Managed Care, Other (non HMO) | Admitting: Family Medicine

## 2017-02-03 ENCOUNTER — Telehealth: Payer: Self-pay | Admitting: Family Medicine

## 2017-02-03 ENCOUNTER — Other Ambulatory Visit: Payer: Self-pay | Admitting: Emergency Medicine

## 2017-02-03 VITALS — BP 140/100 | HR 96 | Temp 98.6°F | Ht 64.0 in | Wt 213.8 lb

## 2017-02-03 DIAGNOSIS — R062 Wheezing: Secondary | ICD-10-CM

## 2017-02-03 DIAGNOSIS — Z5181 Encounter for therapeutic drug level monitoring: Secondary | ICD-10-CM | POA: Diagnosis not present

## 2017-02-03 DIAGNOSIS — J209 Acute bronchitis, unspecified: Secondary | ICD-10-CM | POA: Diagnosis not present

## 2017-02-03 MED ORDER — AZITHROMYCIN 250 MG PO TABS: ORAL_TABLET | ORAL | 0 refills | Status: DC

## 2017-02-03 MED ORDER — IPRATROPIUM-ALBUTEROL 0.5-2.5 (3) MG/3ML IN SOLN: 3.0000 mL | RESPIRATORY_TRACT | 1 refills | Status: DC | PRN

## 2017-02-03 MED ORDER — PREDNISONE 20 MG PO TABS: ORAL_TABLET | ORAL | 0 refills | Status: DC

## 2017-02-03 MED ORDER — IPRATROPIUM-ALBUTEROL 0.5-2.5 (3) MG/3ML IN SOLN: 3.0000 mL | RESPIRATORY_TRACT | 1 refills | Status: AC | PRN

## 2017-02-03 NOTE — Telephone Encounter (Signed)
Caller name:Sadye Fayrene FearingJames Relationship to patient: Can be reached:[phone] [facility] [address]  Reason for call:[facility] will [facility] a couple of days before they have any. please advise

## 2017-02-03 NOTE — Telephone Encounter (Signed)
Taken care of- we changed to a different pharmacy for her

## 2017-02-03 NOTE — Addendum Note (Signed)
Addended by: Abbe AmsterdamOPLAND, Bilal Manzer C on: 02/03/2017 04:47 PM   Modules accepted: Orders

## 2017-02-03 NOTE — Telephone Encounter (Signed)
Patient is currently on the phone  Informed her PCP is taking care of, patient wanted to ensure please send Rx to   [facility]  [address], [place], [place] [address] [phone]

## 2017-02-03 NOTE — Progress Notes (Addendum)
[facility] [address], [address] [place], [place] [address] [phone]  Date:  02/03/2017   Name:  Shannon Martinez   DOB:  [birth year]   MRN:  [id number]  PCP:  Pearline Cables, MD    Chief Complaint: Sore Throat (c/o sore throat)   History of Present Illness:  Shannon Martinez is a 41 y.o. very pleasant female patient who presents with the following:  History of HTN, asthma, pre-diabetes. Here today with illness. Today is Monday- she noted a scratchy throat a week ago. She used benadryl, cough drops, hot tea, honey.  However none of this was very helpful.    She also used a little of some left over rx cough syrup that she had at home.   She has not noted any fever Her cough can be so hard that she will nearly throw up and will bringup some very thick mucus at times  She feels like there is mucus in her throat but not really a ST No nausea, no vomiting except for post- tussive No syncope She is using her inhalers (B agonist and steroid)- she has noted some wheezing but the inhaler did not seem to help.    LMP is current  She has noted thick mucus in her throat which makes it hard for her to breathe  She is using qvar, proair right now, she also does have a neb machine at home   History of pre-diabetes but she is not on any medications for same She is on hctz 25 for her HTN, also diltiazem Noted slightly low K at last labs  BP Readings from Last 3 Encounters:  02/03/17 (!) 140/100  10/31/16 120/70  10/07/16 122/84   Lab Results  Component Value Date   HGBA1C 6.1 10/07/2016     Patient Active Problem List   Diagnosis Date Noted  . Pre-diabetes 10/08/2016  . 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 Migraine variant 07/07/2012  . Obesity, Class I, BMI 30-34.9 07/07/2012  . HTN (hypertension) 07/07/2012  . Left shoulder pain 06/08/2012    Past Medical History:  Diagnosis Date  . Allergy   . Asthma   . Hypertension      Past Surgical History:  Procedure Laterality Date  . BREAST SURGERY     reduction    Social History  Substance Use Topics  . Smoking status: Never Smoker  . Smokeless tobacco: Not on file  . Alcohol use 0.6 oz/week    1 Standard drinks or equivalent per week     Comment: social    Family History  Problem Relation Age of Onset  . Cancer Mother   . Hypertension Father   . Seizures Maternal Grandfather   . Heart disease Paternal Grandmother     Allergies  Allergen Reactions  . Latex Anaphylaxis  . Almond Oil Other (See Comments)    unknown  . Lisinopril Cough  . Other     Hazelnuts and [place]  . Oxycodone Other (See Comments)    Makes her loopy   . Penicillins     childhood Has patient had a PCN reaction causing immediate rash, facial/tongue/throat swelling, SOB or lightheadedness with hypotension: Has patient had a PCN reaction causing severe rash involving mucus membranes or skin necrosis: Has patient had a PCN reaction that required hospitalization  Has patient had a PCN reaction occurring within the last 10 years:  If all of the above answers are "  NO", then may proceed with Cephalosporin use.    . Losartan Rash    Medication list has been reviewed and updated.  Current Outpatient Prescriptions on File Prior to Visit  Medication Sig Dispense Refill  . acetaminophen (TYLENOL) 500 MG tablet Take 1,000 mg by mouth every 6 (six) hours as needed for moderate pain or headache. Reported on 10/14/2015    . albuterol (PROVENTIL HFA;VENTOLIN HFA) 108 (90 BASE) MCG/ACT inhaler Inhale 2 puffs into the lungs every 6 (six) hours as needed for wheezing.    . benzonatate (TESSALON) 100 MG capsule Take 1 capsule (100 mg total) by mouth 3 (three) times daily as needed. 42 capsule 0  . diltiazem (TAZTIA XT) 120 MG 24 hr capsule Take 1 capsule (120 mg total) by mouth daily. 90 capsule 3  . EPINEPHrine (EPI-PEN) 0.3 mg/0.3 mL DEVI Inject 0.3 mLs (0.3 mg total) into the muscle  once. 2 Device 3  . hydrochlorothiazide (HYDRODIURIL) 12.5 MG tablet Take 1 tablet (12.5 mg total) by mouth daily. 90 tablet 3  . Linoleic Acid-Sunflower Oil (CLA) [phone] MG CAPS Take 2 capsules by mouth 2 (two) times daily.    . Multiple Vitamins-Minerals (MULTIVITAMIN WITH MINERALS) tablet Take 1 tablet by mouth daily.    . [DISCONTINUED] SUMAtriptan (IMITREX) 50 MG tablet Take 1 tablet (50 mg total) by mouth every 2 (two) hours as needed for migraine. Max 100 mg daily (Patient not taking: Reported on 12/04/2014) 10 tablet 0   No current facility-administered medications on file prior to visit.     Review of Systems:  As per HPI- otherwise negative.   Physical Examination: Vitals:   02/03/17 1458  BP: (!) 140/100  Pulse: 96  Temp: 98.6 F (37 C)   Vitals:   02/03/17 1442  Weight: 213 lb 12.8 oz (97 kg)  Height: 5\' 4"  (1.626 m)   Body mass index is 36.7 kg/m. Ideal Body Weight: Weight in (lb) to have BMI = 25: 145.3  GEN: WDWN, NAD, Non-toxic, A & O x 3, overweight, looks well HEENT: Atraumatic, Normocephalic. Neck supple. No masses, No LAD.  Bilateral TM wnl, oropharynx normal.  PEERL,EOMI.   Ears and Nose: No external deformity.  CV: RRR, No M/G/R. No JVD. No thrill. No extra heart sounds. PULM: CTA B, no crackles, rhonchi. No retractions. No resp. distress. No accessory muscle use.  Minimal wheezing left ABD: S, NT, ND EXTR: No c/c/e NEURO Normal gait.  PSYCH: Normally interactive. Conversant. Not depressed or anxious appearing.  Calm demeanor.    Assessment and Plan: Acute bronchitis, unspecified organism - Plan: azithromycin (ZITHROMAX) 250 MG tablet, ipratropium-albuterol (DUONEB) 0.5-2.5 (3) MG/3ML SOLN  Wheezing - Plan: ipratropium-albuterol (DUONEB) 0.5-2.5 (3) MG/3ML SOLN, predniSONE (DELTASONE) 20 MG tablet  Medication monitoring encounter - Plan: Basic metabolic panel  Here today with acute illness Will treat with azithromycin, duoneb rx, short course of  prednisone She will let me know if not feeling better soon- Sooner if worse.  Will check on her K today as it was low Meds ordered this encounter  Medications  . azithromycin (ZITHROMAX) 250 MG tablet    Sig: Use as a zpack    Dispense:  6 tablet    Refill:  0  . ipratropium-albuterol (DUONEB) 0.5-2.5 (3) MG/3ML SOLN    Sig: Take 3 mLs by nebulization every 4 (four) hours as needed.    Dispense:  360 mL    Refill:  1  . predniSONE (DELTASONE) 20 MG tablet  Sig: Take 2 pills a day for 3 days    Dispense:  6 tablet    Refill:  0     Signed Abbe Amsterdam, MD  Received her BMP Results for orders placed or performed in visit on 02/03/17  Basic metabolic panel  Result Value Ref Range   Sodium 139 135 - 145 mEq/L   Potassium 3.8 3.5 - 5.1 mEq/L   Chloride 102 96 - 112 mEq/L   CO2 28 19 - 32 mEq/L   Glucose, Bld 95 70 - 99 mg/dL   BUN 11 6 - 23 mg/dL   Creatinine, Ser 1.61 0.40 - 1.20 mg/dL   Calcium 9.7 8.4 - 09.6 mg/dL   GFR 04.54 >09.81 mL/min   Note to pt

## 2017-02-03 NOTE — Patient Instructions (Signed)
We are going to treat you for bronchitis and wheezing with  Azithromycin (antibiotic) duoneb (nebulizer treatments- use as needed Prednisone for just 3 days  Please let me know if you are not feeling better in the next 2 days- Sooner if worse.

## 2017-02-04 ENCOUNTER — Encounter: Payer: Self-pay | Admitting: Family Medicine

## 2017-02-04 LAB — BASIC METABOLIC PANEL
BUN: 11 mg/dL (ref 6–23)
CHLORIDE: 102 meq/L (ref 96–112)
CO2: 28 mEq/L (ref 19–32)
Calcium: 9.7 mg/dL (ref 8.4–10.5)
Creatinine, Ser: 0.9 mg/dL (ref 0.40–1.20)
GFR: 88.23 mL/min (ref >60.00)
Glucose, Bld: 95 mg/dL (ref 70–99)
POTASSIUM: 3.8 meq/L (ref 3.5–5.1)
SODIUM: 139 meq/L (ref 135–145)

## 2017-02-10 ENCOUNTER — Telehealth: Payer: Self-pay | Admitting: Family Medicine

## 2017-02-10 NOTE — Telephone Encounter (Signed)
Called her and left detailed message- if she has any sx of fever, chills or malaise we should get a chest x-ray, please come in in this case.  If just still coughing but otherwise feels well would suggest that we give his more time- cough can take a long time to clear up.  We can certainly give her a few more days of prednisone if she would like. Please call me back if needed

## 2017-02-10 NOTE — Telephone Encounter (Signed)
Caller name:Breeley Fayrene FearingJames Relationship to patient: Can be [phone] [facility] [address]  Reason for call:Meds prescribed last week are not working, still taking albuterol. Still has terrible cough

## 2017-02-21 ENCOUNTER — Encounter: Payer: Self-pay | Admitting: Family Medicine

## 2017-02-21 ENCOUNTER — Ambulatory Visit (INDEPENDENT_AMBULATORY_CARE_PROVIDER_SITE_OTHER): Payer: Managed Care, Other (non HMO) | Admitting: Family Medicine

## 2017-02-21 VITALS — BP 118/90 | HR 102 | Temp 98.5°F | Ht 64.0 in | Wt 215.6 lb

## 2017-02-21 DIAGNOSIS — I10 Essential (primary) hypertension: Secondary | ICD-10-CM

## 2017-02-21 DIAGNOSIS — R059 Cough, unspecified: Secondary | ICD-10-CM

## 2017-02-21 DIAGNOSIS — R05 Cough: Secondary | ICD-10-CM

## 2017-02-21 DIAGNOSIS — M545 Low back pain, unspecified: Secondary | ICD-10-CM

## 2017-02-21 LAB — POCT URINALYSIS DIPSTICK
Bilirubin, UA: NEGATIVE
Blood, UA: NEGATIVE
GLUCOSE UA: NEGATIVE
Ketones, UA: NEGATIVE
Leukocytes, UA: NEGATIVE
NITRITE UA: NEGATIVE
Protein, UA: NEGATIVE
Spec Grav, UA: 1.01 (ref 1.010–1.025)
Urobilinogen, UA: 0.2 E.U./dL
pH, UA: 6.5 (ref 5.0–8.0)

## 2017-02-21 MED ORDER — CYCLOBENZAPRINE HCL 5 MG PO TABS: 5.0000 mg | ORAL_TABLET | Freq: Every evening | ORAL | 0 refills | Status: DC | PRN

## 2017-02-21 NOTE — Patient Instructions (Signed)
BEFORE YOU LEAVE: -low back exercises -follow up: with your doctor in 1-2 weeks for the blood pressure and the back pain  Heat for 15 minutes twice daily  Do the exercises provided 4 days per week  For pain can use heat, tiger balm (methol - over the counter), tylenol or aleve per insturctions  Can take the flexeril (muscle relaxer) at night if needed  I hope you are feeling better soon! Seek care immediately if worsening, new concerns or you are not improving with treatment.

## 2017-02-21 NOTE — Progress Notes (Signed)
HPI:  Acute visit for Martinez low back pain: -woke up with this yesterday, thought slept wrong -constant, mod, dull pain Martinez low-mid back -no radiation to legs, weakness, numbness, fevers, malaise, sob, dysuria, hematuria, hx kidney stones, diarrhea, constipation -recovering from asthma flare/bronchitis - had been coughing a lot - improving -FDLMP 01/30/17  ROS: See pertinent positives and negatives per HPI.  Past Medical History:  Diagnosis Date  . Allergy   . Asthma   . Hypertension     Past Surgical History:  Procedure Laterality Date  . BREAST SURGERY     reduction    Family History  Problem Relation Age of Onset  . Cancer Mother   . Hypertension Father   . Seizures Maternal Grandfather   . Heart disease Paternal Grandmother     Social History   Social History  . Marital status: Married    Spouse name: N/A  . Number of children: N/A  . Years of education: N/A   Social History Main Topics  . Smoking status: Never Smoker  . Smokeless tobacco: Never Used  . Alcohol use 0.6 oz/week    1 Standard drinks or equivalent per week     Comment: social  . Drug use: No  . Sexual activity: Yes   Other Topics Concern  . None   Social History Narrative  . None     Current Outpatient Prescriptions:  .  albuterol (PROVENTIL HFA;VENTOLIN HFA) 108 (90 BASE) MCG/ACT inhaler, Inhale 2 puffs into the lungs every 6 (six) hours as needed for wheezing., Disp: , Rfl:  .  cyclobenzaprine (FLEXERIL) 5 MG tablet, Take 1 tablet (5 mg total) by mouth at bedtime as needed for muscle spasms., Disp: 20 tablet, Rfl: 0 .  diltiazem (TAZTIA XT) 120 MG 24 hr capsule, Take 1 capsule (120 mg total) by mouth daily., Disp: 90 capsule, Rfl: 3 .  EPINEPHrine (EPI-PEN) 0.3 mg/0.3 mL DEVI, Inject 0.3 mLs (0.3 mg total) into the muscle once., Disp: 2 Device, Rfl: 3 .  hydrochlorothiazide (HYDRODIURIL) 25 MG tablet, hydrochlorothiazide 25 mg tablet, Disp: , Rfl:  .  ipratropium-albuterol (DUONEB) 0.5-2.5  (3) MG/3ML SOLN, Take 3 mLs by nebulization every 4 (four) hours as needed., Disp: 360 mL, Rfl: 1 .  Multiple Vitamins-Minerals (MULTIVITAMIN WITH MINERALS) tablet, Take 1 tablet by mouth daily., Disp: , Rfl:   EXAM:  Vitals:   02/21/17 1529  BP: 124/90  Pulse: (!) 102  Temp: 98.5 F (36.9 C)    Body mass index is 37.01 kg/m.  GENERAL: vitals reviewed and listed above, alert, oriented, appears well hydrated and in no acute distress  HEENT: atraumatic, conjunttiva clear, no obvious abnormalities on inspection of external nose and ears  NECK: no obvious masses on inspection  LUNGS: clear to auscultation bilaterally, no wheezes, rales or rhonchi, good air movement  CV: HRRR, no peripheral edema  ABD: soft, no sig TTP, rebound or guarding  MS: moves all extremities without noticeable abnormality Normal Gait Normal inspection of back except for increased lumbar curvature, no obvious scoliosis or leg length descrepancy No bony TTP Soft tissue TTP at: Martinez mid-upper lumbar paraspinal muscles - reproduces pain -/+ tests: neg trendelenburg,-facet loading, -SLRT, -CLRT, -FABER, -FADIR  PSYCH: pleasant and cooperative, no obvious depression or anxiety  ASSESSMENT AND PLAN:  Discussed the following assessment and plan:  Acute right-sided low back pain without sciatica - Plan: POC Urinalysis Dipstick  Cough  Essential hypertension  -we discussed possible serious and likely etiologies, workup and treatment,  treatment risks and return precautions; suspect musculoskeletal etiology most likely -after this discussion, Shannon Martinez opted for udip - negative, flexeril, heat, aleve if needed, HEP and close follow up with PCP for this and to recheck BP -seems cough is much better -follow up advised  In 1-2 weeks with PCP -of course, we advised Shannon Martinez  to return or notify a doctor immediately if symptoms worsen or persist or new concerns arise.   Patient Instructions  BEFORE YOU  LEAVE: -low back exercises -follow up: with your doctor in 1-2 weeks for the blood pressure and the back pain  Heat for 15 minutes twice daily  Do the exercises provided 4 days per week  For pain can use heat, tiger balm (methol - over the counter), tylenol or aleve per insturctions  Can take the flexeril (muscle relaxer) at night if needed  I hope you are feeling better soon! Seek care immediately if worsening, new concerns or you are not improving with treatment.     Shannon Martinez., DO

## 2017-08-28 ENCOUNTER — Other Ambulatory Visit: Payer: Self-pay | Admitting: Family Medicine

## 2017-08-28 DIAGNOSIS — I10 Essential (primary) hypertension: Secondary | ICD-10-CM

## 2017-10-02 DIAGNOSIS — N856 Intrauterine synechiae: Secondary | ICD-10-CM | POA: Diagnosis not present

## 2017-11-14 DIAGNOSIS — N856 Intrauterine synechiae: Secondary | ICD-10-CM | POA: Diagnosis not present

## 2017-11-14 DIAGNOSIS — D251 Intramural leiomyoma of uterus: Secondary | ICD-10-CM | POA: Diagnosis not present

## 2018-01-27 DIAGNOSIS — I10 Essential (primary) hypertension: Secondary | ICD-10-CM | POA: Diagnosis not present

## 2018-01-27 DIAGNOSIS — M5412 Radiculopathy, cervical region: Secondary | ICD-10-CM | POA: Diagnosis not present

## 2018-01-27 DIAGNOSIS — J302 Other seasonal allergic rhinitis: Secondary | ICD-10-CM | POA: Diagnosis not present

## 2018-01-28 ENCOUNTER — Other Ambulatory Visit: Payer: Self-pay | Admitting: Family Medicine

## 2018-01-28 DIAGNOSIS — I10 Essential (primary) hypertension: Secondary | ICD-10-CM

## 2018-02-02 DIAGNOSIS — M5412 Radiculopathy, cervical region: Secondary | ICD-10-CM | POA: Diagnosis not present

## 2018-02-02 DIAGNOSIS — I10 Essential (primary) hypertension: Secondary | ICD-10-CM | POA: Diagnosis not present

## 2018-02-02 DIAGNOSIS — J302 Other seasonal allergic rhinitis: Secondary | ICD-10-CM | POA: Diagnosis not present

## 2018-02-02 DIAGNOSIS — J45909 Unspecified asthma, uncomplicated: Secondary | ICD-10-CM | POA: Diagnosis not present

## 2018-09-18 DIAGNOSIS — M79645 Pain in left finger(s): Secondary | ICD-10-CM | POA: Diagnosis not present

## 2018-10-13 NOTE — Progress Notes (Signed)
[facility] [place] [address], [address] [place], [place] [address] [phone] [phone]  Date:  10/14/2018   Name:  Shannon LevelVeronica B Halberstadt   DOB:  11/13/1975   MRN:  [id number]  PCP:  Pearline Cablesopland, Jansen Goodpasture C, MD    Chief Complaint: Hand Pain (LEFT)   History of Present Illness:  Shannon Martinez is a 43 y.o. very pleasant female patient who presents with the following:  Suzette BattiestVeronica has a history of prediabetes, obesity, hypertension.  Last seen by myself in May 2018 for sick visit. Here today with concern of pain in her hand She awoke with pain in her left hand on 12/20- she went to [facility], they did an x-ray and put her in a splint.  She is wearing this pretty much all the time, however it does not seem to be helping  She cannot recall any fall or other accident or injury. 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  We decided to defer tetanus shot today. Flu shot: declines   Patient Active Problem List   Diagnosis Date Noted  . Pre-diabetes 10/08/2016  . 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 Migraine variant 07/07/2012  . Obesity, Class I, BMI 30-34.9 07/07/2012  . HTN (hypertension) 07/07/2012  . Left shoulder pain 06/08/2012    Past Medical History:  Diagnosis Date  . Allergy   . Asthma   . Hypertension     Past Surgical History:  Procedure Laterality Date  . BREAST SURGERY     reduction    Social History   Tobacco Use  . Smoking status: Never Smoker  . Smokeless tobacco: Never Used  Substance Use Topics  . Alcohol use: Yes    Alcohol/week: 1.0 standard drinks    Types: 1 Standard drinks or  equivalent per week    Comment: social  . Drug use: No    Family History  Problem Relation Age of Onset  . Cancer Mother   . Hypertension Father   . Seizures Maternal Grandfather   . Heart disease Paternal Grandmother     Allergies  Allergen Reactions  . Latex Anaphylaxis  . Almond Oil Other (See Comments)    unknown  . Lisinopril Cough  . Other     Hazelnuts and [place]  . Oxycodone Other (See Comments)    Makes her loopy   . Penicillins     childhood Has patient had a PCN reaction causing immediate rash, facial/tongue/throat swelling, SOB or lightheadedness with hypotension: Has patient had a PCN reaction causing severe rash involving mucus membranes or skin necrosis: Has patient had a PCN reaction that required hospitalization  Has patient had a PCN reaction occurring within the last 10 years:  If all of the above answers are "NO", then may proceed with Cephalosporin use.    . Losartan Rash    Medication list has been reviewed and updated.  Current Outpatient Medications on File Prior to Visit  Medication Sig Dispense Refill  . albuterol (PROVENTIL HFA;VENTOLIN HFA) 108 (90 BASE) MCG/ACT inhaler Inhale 2 puffs into the lungs every 6 (  six) hours as needed for wheezing.    . diltiazem (TIAZAC) 120 MG 24 hr capsule TAKE ONE CAPSULE BY MOUTH ONCE DAILY 90 capsule 3  . EPINEPHrine (EPI-PEN) 0.3 mg/0.3 mL DEVI Inject 0.3 mLs (0.3 mg total) into the muscle once. 2 Device 3  . hydrochlorothiazide (HYDRODIURIL) 25 MG tablet TAKE ONE TABLET BY MOUTH ONCE DAILY 90 tablet 3  . ipratropium-albuterol (DUONEB) 0.5-2.5 (3) MG/3ML SOLN Take 3 mLs by nebulization every 4 (four) hours as needed. 360 mL 1  . [DISCONTINUED] SUMAtriptan (IMITREX) 50 MG tablet Take 1 tablet (50 mg total) by mouth every 2 (two) hours as needed for migraine. Max 100 mg daily (Patient not taking: Reported on 12/04/2014) 10 tablet 0   No current facility-administered medications on file prior to visit.      Review of Systems:  As per HPI- otherwise negative. No fever or chills, no fall or other injury.  No other joint pains   Physical Examination: Vitals:   10/14/18 1101  BP: 140/90  Pulse: 73  Resp: 16  Temp: 98.5 F (36.9 C)  SpO2: 97%   Vitals:   10/14/18 1101  Weight: 220 lb (99.8 kg)  Height: 5\' 4"  (1.626 m)   Body mass index is 37.76 kg/m. Ideal Body Weight: Weight in (lb) to have BMI = 25: 145.3  GEN: WDWN, NAD, Non-toxic, A & O x 3, obese, looks well HEENT: Atraumatic, Normocephalic. Neck supple. No masses, No LAD. Ears and Nose: No external deformity. CV: RRR, No M/G/R. No JVD. No thrill. No extra heart sounds. PULM: CTA B, no wheezes, crackles, rhonchi. No retractions. No resp. distress. No accessory muscle use. EXTR: No c/c/e NEURO Normal gait.  PSYCH: Normally interactive. Conversant. Not depressed or anxious appearing.  Calm demeanor.  No swelling, redness, heat of either hand or wrist. Wrist shows normal range of motion with no pain. Patient has a mild trigger finger at the IP joint of the left thumb.  She also shows tenderness at the left first MCP joint Finkelstein negative Assessment and Plan: Sprain of interphalangeal joint of left thumb, initial encounter - Plan: meloxicam (MOBIC) 7.5 MG tablet, Ambulatory referral to Hand Surgery  Essential hypertension  Pre-diabetes  Immunization due - Plan: CANCELED: Tdap vaccine greater than or equal to 7yo IM  Suzette BattiestVeronica is here today with a left thumb issue.  She has a trigger finger, but also seems to have possibly arthritis at the first MCP joint.  She has not taken any medication at this time, so I gave her prescription for meloxicam to use as needed.  She is advised not to take other NSAIDs along with this.  Referral to hand surgery for evaluation. Did not give tetanus vaccine due to latex allergy  She is taking diltiazem and HCTZ, and her blood pressure is reasonable  Signed Abbe AmsterdamJessica Malisha Mabey, MD

## 2018-10-14 ENCOUNTER — Ambulatory Visit: Payer: BLUE CROSS/BLUE SHIELD | Admitting: Family Medicine

## 2018-10-14 ENCOUNTER — Encounter: Payer: Self-pay | Admitting: Family Medicine

## 2018-10-14 VITALS — BP 140/90 | HR 73 | Temp 98.5°F | Resp 16 | Ht 64.0 in | Wt 220.0 lb

## 2018-10-14 DIAGNOSIS — S63622A Sprain of interphalangeal joint of left thumb, initial encounter: Secondary | ICD-10-CM | POA: Diagnosis not present

## 2018-10-14 DIAGNOSIS — I10 Essential (primary) hypertension: Secondary | ICD-10-CM

## 2018-10-14 DIAGNOSIS — R7303 Prediabetes: Secondary | ICD-10-CM | POA: Diagnosis not present

## 2018-10-14 DIAGNOSIS — Z23 Encounter for immunization: Secondary | ICD-10-CM

## 2018-10-14 MED ORDER — MELOXICAM 7.5 MG PO TABS: 7.5000 mg | ORAL_TABLET | Freq: Every day | ORAL | 0 refills | Status: AC | PRN

## 2018-10-14 NOTE — Patient Instructions (Signed)
It was good to see you today I am going to refer you to orthopedics to have them look at your hand Use the splint if it seems to be helpful only mobic as needed for pain

## 2018-10-26 DIAGNOSIS — M65312 Trigger thumb, left thumb: Secondary | ICD-10-CM | POA: Diagnosis not present

## 2018-10-29 ENCOUNTER — Ambulatory Visit: Payer: Self-pay

## 2018-10-29 ENCOUNTER — Ambulatory Visit: Payer: BLUE CROSS/BLUE SHIELD | Admitting: Family Medicine

## 2018-10-29 ENCOUNTER — Encounter: Payer: Self-pay | Admitting: Family Medicine

## 2018-10-29 VITALS — BP 138/90 | HR 84 | Temp 98.4°F | Resp 16 | Ht 64.0 in | Wt 220.0 lb

## 2018-10-29 DIAGNOSIS — I10 Essential (primary) hypertension: Secondary | ICD-10-CM

## 2018-10-29 DIAGNOSIS — R079 Chest pain, unspecified: Secondary | ICD-10-CM | POA: Diagnosis not present

## 2018-10-29 DIAGNOSIS — Z131 Encounter for screening for diabetes mellitus: Secondary | ICD-10-CM

## 2018-10-29 DIAGNOSIS — Z13220 Encounter for screening for lipoid disorders: Secondary | ICD-10-CM | POA: Diagnosis not present

## 2018-10-29 DIAGNOSIS — E876 Hypokalemia: Secondary | ICD-10-CM

## 2018-10-29 LAB — CBC
HCT: 39.3 % (ref 36.0–46.0)
Hemoglobin: 13 g/dL (ref 12.0–15.0)
MCHC: 33.1 g/dL (ref 30.0–36.0)
MCV: 84.6 fl (ref 78.0–100.0)
Platelets: 446 10*3/uL — ABNORMAL HIGH (ref 150.0–400.0)
RBC: 4.64 Mil/uL (ref 3.87–5.11)
RDW: 14.5 % (ref 11.5–15.5)
WBC: 11 10*3/uL — ABNORMAL HIGH (ref 4.0–10.5)

## 2018-10-29 LAB — COMPREHENSIVE METABOLIC PANEL
ALT: 14 U/L (ref 0–35)
AST: 15 U/L (ref 0–37)
Albumin: 3.9 g/dL (ref 3.5–5.2)
Alkaline Phosphatase: 64 U/L (ref 39–117)
BILIRUBIN TOTAL: 0.3 mg/dL (ref 0.2–1.2)
BUN: 8 mg/dL (ref 6–23)
CO2: 31 mEq/L (ref 19–32)
Calcium: 9.5 mg/dL (ref 8.4–10.5)
Chloride: 99 mEq/L (ref 96–112)
Creatinine, Ser: 0.79 mg/dL (ref 0.40–1.20)
GFR: 95.7 mL/min (ref >60.00)
Glucose, Bld: 119 mg/dL — ABNORMAL HIGH (ref 70–99)
Potassium: 3.2 mEq/L — ABNORMAL LOW (ref 3.5–5.1)
Sodium: 136 mEq/L (ref 135–145)
Total Protein: 7.5 g/dL (ref 6.0–8.3)

## 2018-10-29 LAB — TROPONIN I: TNIDX: 0.03 ug/l (ref 0.00–0.06)

## 2018-10-29 LAB — LIPID PANEL
Cholesterol: 138 mg/dL (ref 0–200)
HDL: 45.5 mg/dL (ref >39.00)
LDL Cholesterol: 70 mg/dL (ref 0–99)
NonHDL: 92.56
Total CHOL/HDL Ratio: 3
Triglycerides: 112 mg/dL (ref 0.0–149.0)
VLDL: 22.4 mg/dL (ref 0.0–40.0)

## 2018-10-29 LAB — HEMOGLOBIN A1C: Hgb A1c MFr Bld: 6.4 % (ref 4.6–6.5)

## 2018-10-29 MED ORDER — HYDROCHLOROTHIAZIDE 25 MG PO TABS: 25.0000 mg | ORAL_TABLET | Freq: Every day | ORAL | 3 refills | Status: AC

## 2018-10-29 MED ORDER — POTASSIUM CHLORIDE CRYS ER 20 MEQ PO TBCR: 20.0000 meq | EXTENDED_RELEASE_TABLET | Freq: Every day | ORAL | 3 refills | Status: AC

## 2018-10-29 NOTE — Progress Notes (Addendum)
[facility] [address], [address] [place], [place] [address] [phone]  Date:  10/29/2018   Name:  Shannon Martinez   DOB:  1976-02-12   MRN:  [id number]  PCP:  Pearline Cables, MD    Chief Complaint: Chest Pain (medication-steroid injection related? started this morning, chest tightness)   History of Present Illness:  Shannon Martinez is a 43 y.o. very pleasant female patient who presents with the following:  Seen by myself just recently for thumb injury- this was treated with a steroid injection 4 days ago She is also taking meloxicam for this for the next 6 weeks  She is in a thumb splint until recheck 6 weeks from now   History of prediabetes, hypertension, obesity  She is here today with chest pain that she first noted this am.   She called into the nurse triage line, and they made her this appointment.   Pain first noticed while she was getting ready for work.  She indicates the left sternoclavicular/ costochondral border as the location of her pain It will come and go but does not seem to follow any sort of pattern, may last a minute or so She has no known injury No exertional pattern No SOB Never had this in the past No pain with swallowing, no sensation of a foreign body in her throat  She did have GERD years ago, but this does not seem like those sx There is heart disease in her grandparents, her mother died of cancer, dad has HTN Pt has no personal history of CAD  She is a non smoker, not taking hormones  BP Readings from Last 3 Encounters:  10/29/18 138/90  10/14/18 140/90  02/21/17 118/90     Patient Active Problem List   Diagnosis Date Noted  . Pre-diabetes 10/08/2016  . 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 Migraine variant 07/07/2012  . Obesity, Class I, BMI 30-34.9 07/07/2012  . HTN (hypertension) 07/07/2012  . Left shoulder pain 06/08/2012    Past Medical History:  Diagnosis Date  . Allergy    . Asthma   . Hypertension     Past Surgical History:  Procedure Laterality Date  . BREAST SURGERY     reduction    Social History   Tobacco Use  . Smoking status: Never Smoker  . Smokeless tobacco: Never Used  Substance Use Topics  . Alcohol use: Yes    Alcohol/week: 1.0 standard drinks    Types: 1 Standard drinks or equivalent per week    Comment: social  . Drug use: No    Family History  Problem Relation Age of Onset  . Cancer Mother   . Hypertension Father   . Seizures Maternal Grandfather   . Heart disease Paternal Grandmother     Allergies  Allergen Reactions  . Latex Anaphylaxis  . Almond Oil Other (See Comments)    unknown  . Lisinopril Cough  . Other     Hazelnuts and [place]  . Oxycodone Other (See Comments)    Makes her loopy   . Penicillins     childhood Has patient had a PCN reaction causing immediate rash, facial/tongue/throat swelling, SOB or lightheadedness with hypotension: Has patient had a PCN reaction causing severe rash involving mucus membranes or skin necrosis: Has patient had a PCN reaction that required hospitalization  Has patient had a PCN reaction occurring within the last 10 years:  If all of the  above answers are "NO", then may proceed with Cephalosporin use.    . Losartan Rash    Medication list has been reviewed and updated.  Current Outpatient Medications on File Prior to Visit  Medication Sig Dispense Refill  . albuterol (PROVENTIL HFA;VENTOLIN HFA) 108 (90 BASE) MCG/ACT inhaler Inhale 2 puffs into the lungs every 6 (six) hours as needed for wheezing.    . diltiazem (TIAZAC) 120 MG 24 hr capsule TAKE ONE CAPSULE BY MOUTH ONCE DAILY 90 capsule 3  . EPINEPHrine (EPI-PEN) 0.3 mg/0.3 mL DEVI Inject 0.3 mLs (0.3 mg total) into the muscle once. 2 Device 3  . hydrochlorothiazide (HYDRODIURIL) 25 MG tablet TAKE ONE TABLET BY MOUTH ONCE DAILY 90 tablet 3  . ipratropium-albuterol (DUONEB) 0.5-2.5 (3) MG/3ML SOLN Take 3 mLs by  nebulization every 4 (four) hours as needed. 360 mL 1  . meloxicam (MOBIC) 7.5 MG tablet Take 1 tablet (7.5 mg total) by mouth daily as needed for pain. 30 tablet 0  . Multiple Vitamins-Minerals (MULTIVITAMIN ADULT PO) Take by mouth.    . [DISCONTINUED] SUMAtriptan (IMITREX) 50 MG tablet Take 1 tablet (50 mg total) by mouth every 2 (two) hours as needed for migraine. Max 100 mg daily (Patient not taking: Reported on 12/04/2014) 10 tablet 0   No current facility-administered medications on file prior to visit.     Review of Systems:  As per HPI- otherwise negative. No fever or chills, no nausea, sweating, jaw pain No shortness of breath   Physical Examination: Vitals:   10/29/18 1422  BP: 138/90  Pulse: 84  Resp: 16  Temp: 98.4 F (36.9 C)  SpO2: 98%   Vitals:   10/29/18 1422  Weight: 220 lb (99.8 kg)  Height: 5\' 4"  (1.626 m)   Body mass index is 37.76 kg/m. Ideal Body Weight: Weight in (lb) to have BMI = 25: 145.3  GEN: WDWN, NAD, Non-toxic, A & O x 3, obese, looks well HEENT: Atraumatic, Normocephalic. Neck supple. No masses, No LAD.  Bilateral TM wnl, oropharynx normal.  PEERL,EOMI.   Neck is benign, no meningismus Ears and Nose: No external deformity. CV: RRR, No M/G/R. No JVD. No thrill. No extra heart sounds. PULM: CTA B, no wheezes, crackles, rhonchi. No retractions. No resp. distress. No accessory muscle use. ABD: S, NT, ND,. No rebound. No HSM.  Belly benign EXTR: No c/c/e NEURO Normal gait.  PSYCH: Normally interactive. Conversant. Not depressed or anxious appearing.  Calm demeanor.  She is tender to pressure at the left sternoclavicular and costochondral junctions.  She is not tender on the right-hand side.  No redness, swelling, skin changes.  EKG: Sinus rhythm, no concerning changes.  Compared with EKG from 2013, which is virtually identical to today's tracing Assessment and Plan: Chest pain, unspecified type - Plan: EKG 12-Lead, Troponin I -  Essential  hypertension - Plan: hydrochlorothiazide (HYDRODIURIL) 25 MG tablet, CBC, Comprehensive metabolic panel  Screening for hyperlipidemia - Plan: Lipid panel  Screening for diabetes mellitus - Plan: Hemoglobin A1c  Here today with apparent musculoskeletal chest pain.  Discussed with patient in detail.  Her exam is most consistent with musculoskeletal pain, and her EKG is normal.  However, this does not guarantee that she does not have cardiac chest pain or another more significant problem.  I am glad to have her seen in the emergency room.  I am also happy to order chest x-ray.  At this time she declines, but does accept a stat troponin which I will  order for her now. She is also due for routine labs, which we will draw at the same time I have advised patient that if her troponin is positive (which I do not expect) we will have her go to the emergency room Otherwise she is to seek care if any worsening or change of her symptoms Continue mobic, can add tylenol as needed for pain  Signed Abbe AmsterdamJessica Jood Retana, MD  Received her labs  Results for orders placed or performed in visit on 10/29/18  CBC  Result Value Ref Range   WBC 11.0 (H) 4.0 - 10.5 K/uL   RBC 4.64 3.87 - 5.11 Mil/uL   Platelets 446.0 (H) 150.0 - 400.0 K/uL   Hemoglobin 13.0 12.0 - 15.0 g/dL   HCT 16.139.3 09.636.0 - 04.546.0 %   MCV 84.6 78.0 - 100.0 fl   MCHC 33.1 30.0 - 36.0 g/dL   RDW 40.914.5 81.111.5 - 91.415.5 %  Comprehensive metabolic panel  Result Value Ref Range   Sodium 136 135 - 145 mEq/L   Potassium 3.2 (L) 3.5 - 5.1 mEq/L   Chloride 99 96 - 112 mEq/L   CO2 31 19 - 32 mEq/L   Glucose, Bld 119 (H) 70 - 99 mg/dL   BUN 8 6 - 23 mg/dL   Creatinine, Ser 7.820.79 0.40 - 1.20 mg/dL   Total Bilirubin 0.3 0.2 - 1.2 mg/dL   Alkaline Phosphatase 64 39 - 117 U/L   AST 15 0 - 37 U/L   ALT 14 0 - 35 U/L   Total Protein 7.5 6.0 - 8.3 g/dL   Albumin 3.9 3.5 - 5.2 g/dL   Calcium 9.5 8.4 - 95.610.5 mg/dL   GFR 21.3095.70 >86.57>60.00 mL/min  Hemoglobin A1c  Result  Value Ref Range   Hgb A1c MFr Bld 6.4 4.6 - 6.5 %  Lipid panel  Result Value Ref Range   Cholesterol 138 0 - 200 mg/dL   Triglycerides 846.9112.0 0.0 - 149.0 mg/dL   HDL 62.9545.50 >28.41>39.00 mg/dL   VLDL 32.422.4 0.0 - 40.140.0 mg/dL   LDL Cholesterol 70 0 - 99 mg/dL   Total CHOL/HDL Ratio 3    NonHDL 92.56   Troponin I -  Result Value Ref Range   TNIDX 0.03 0.00 - 0.06 ug/l   Called her- LMOM Troponin is negative Labs are ok except for mild hypokalemia I am going to rx a potassium supplement- take for 3 days and then hold the rest of the rx for later use  Letter to pt with other details   The 10-year ASCVD risk score Denman George(Goff DC Montez HagemanJr., et al., 2013) is: 3%   Values used to calculate the score:     Age: [age] years     Sex: Female     Is Non-Hispanic African American: Yes     Diabetic: No     Tobacco smoker: No     Systolic Blood Pressure: 138 mmHg     Is BP treated: Yes     HDL Cholesterol: 45.5 mg/dL     Total Cholesterol: 138 mg/dL  Called on 0/271/31 to check on her Her chest pain is much better Discussed potassium, after she finishes 3 days of supp she will work on increased dietary potassium intake

## 2018-10-29 NOTE — Telephone Encounter (Signed)
FYI

## 2018-10-29 NOTE — Telephone Encounter (Signed)
Patient called in with c/o "chest pain." She says "it's located between my left collar bone and upper part of my breast, going a little into my neck above the collar bone. I had a steroid injection in my finger a few days ago and don't know if it's that or something else going on. The pain is annoying at a 3-4, makes me want to rub it, almost like a muscle pulling. It lasts about 30-60 seconds, then goes away. I have pain now, but it's easing as we're talking." I asked about other symptoms, she denies, but says she has a little cough that started today. According to protocol, see PCP within 3 days, appointment scheduled for today at 1415 with Dr. Patsy Lageropland, care advice given, patient verbalized understanding.  Reason for Disposition . [1] Intermittent chest pain from "angina" AND [2] NO increase in severity or frequency  Answer Assessment - Initial Assessment Questions 1. LOCATION: "Where does it hurt?"       Between left collar bone and upper part of breast 2. RADIATION: "Does the pain go anywhere else?" (e.g., into neck, jaw, arms, back)     Left neck a little right where the collar bone is  3. ONSET: "When did the chest pain begin?" (Minutes, hours or days)      This morning 4. PATTERN "Does the pain come and go, or has it been constant since it started?"  "Does it get worse with exertion?"      Come and go 5. DURATION: "How long does it last" (e.g., seconds, minutes, hours)     Maybe 30-60 seconds 6. SEVERITY: "How bad is the pain?"  (e.g., Scale 1-10; mild, moderate, or severe)    - MILD (1-3): doesn't interfere with normal activities     - MODERATE (4-7): interferes with normal activities or awakens from sleep    - SEVERE (8-10): excruciating pain, unable to do any normal activities       3-4 annoying pain 7. CARDIAC RISK FACTORS: "Do you have any history of heart problems or risk factors for heart disease?" (e.g., prior heart attack, angina; high blood pressure, diabetes, being overweight,  high cholesterol, smoking, or strong family history of heart disease)     HTN 8. PULMONARY RISK FACTORS: "Do you have any history of lung disease?"  (e.g., blood clots in lung, asthma, emphysema, birth control pills)     Asthma 9. CAUSE: "What do you think is causing the chest pain?"     I don't know 10. OTHER SYMPTOMS: "Do you have any other symptoms?" (e.g., dizziness, nausea, vomiting, sweating, fever, difficulty breathing, cough)       A little cough today 11. PREGNANCY: "Is there any chance you are pregnant?" "When was your last menstrual period?"       No  Protocols used: CHEST PAIN-A-AH

## 2018-10-29 NOTE — Addendum Note (Signed)
Addended by: Abbe Amsterdam C on: 10/29/2018 06:03 PM   Modules accepted: Orders

## 2018-10-29 NOTE — Patient Instructions (Signed)
It seems that you are having musculoskeletal chest pain.  The meloxicam you are taking for your thumb may be helpful for this, and you can also add some over-the-counter Tylenol if needed We will do your regular labs today, and also draw troponin for further reassurance that all is well with your heart.  I will be in touch at this lab later on today If you have any worsening of your pain, or start to experience any severe pain, shortness of breath, jaw pain/nausea/sweating please go to the emergency room

## 2018-12-08 DIAGNOSIS — Z0001 Encounter for general adult medical examination with abnormal findings: Secondary | ICD-10-CM | POA: Diagnosis not present

## 2019-05-03 ENCOUNTER — Other Ambulatory Visit: Payer: Self-pay

## 2019-05-04 ENCOUNTER — Ambulatory Visit: Payer: BC Managed Care – PPO | Admitting: Family Medicine

## 2019-05-04 ENCOUNTER — Encounter: Payer: Self-pay | Admitting: Family Medicine

## 2019-05-04 ENCOUNTER — Ambulatory Visit (HOSPITAL_BASED_OUTPATIENT_CLINIC_OR_DEPARTMENT_OTHER)
Admission: RE | Admit: 2019-05-04 | Discharge: 2019-05-04 | Disposition: A | Discharge status: Home / Self Care | Payer: BC Managed Care – PPO | Source: Ambulatory Visit | Attending: Family Medicine | Admitting: Family Medicine

## 2019-05-04 VITALS — BP 118/80 | HR 97 | Temp 98.5°F | Ht 64.0 in | Wt 221.0 lb

## 2019-05-04 DIAGNOSIS — M7731 Calcaneal spur, right foot: Secondary | ICD-10-CM | POA: Diagnosis not present

## 2019-05-04 DIAGNOSIS — Z23 Encounter for immunization: Secondary | ICD-10-CM

## 2019-05-04 DIAGNOSIS — M795 Residual foreign body in soft tissue: Secondary | ICD-10-CM

## 2019-05-04 DIAGNOSIS — W25XXXA Contact with sharp glass, initial encounter: Secondary | ICD-10-CM

## 2019-05-04 NOTE — Patient Instructions (Addendum)
Stop downstairs to get a disc of your X-ray to bring to your appointment.   You will have a sore shoulder over the next couple days.  When you wash, use only soap and water. Do not vigorously scrub. Apply triple antibiotic ointment (like Neosporin) twice daily. Keep the area clean and dry.   Things to look out for: increasing pain not relieved by ibuprofen/acetaminophen, fevers, spreading redness, drainage of pus, or foul odor.  It does look like you have a small piece of glass in your foot. Unfortunately I cannot get a good enough view to safely remove it today.  OK to take Tylenol 1000 mg (2 extra strength tabs) or 975 mg (3 regular strength tabs) every 6 hours as needed.  Let us know if you need anything.

## 2019-05-04 NOTE — Addendum Note (Signed)
Addended by: Ames Coupe on: 05/04/2019 08:51 AM   Modules accepted: Level of Service

## 2019-05-04 NOTE — Progress Notes (Addendum)
Chief Complaint  Patient presents with  . Foot Injury    Right may have stepped on glass    Subjective: Patient is a 43 y.o. female here for R foot inj.  Yesterday was in kitchen, stepped on something sharp which she believes was glass. Speculates that one of her fam members broke some glass, cleaned most of it up, but missed a small piece. Her heel bled a little, painful. Does not remember when her most recent tetanus shot was. Here to make sure there is nothing inside her foot. Still hurts when she bears weight.    ROS: Skin: +heel pain  Past Medical History:  Diagnosis Date  . Allergy   . Asthma   . Hypertension     Objective: BP 118/80 (BP Location: Left Arm, Patient Position: Sitting, Cuff Size: Large)   Pulse 97   Temp 98.5 F (36.9 C) (Oral)   Ht 5\' 4"  (1.626 m)   Wt 221 lb (100.2 kg)   SpO2 98%   BMI 37.93 kg/m  General: Awake, appears stated age Heart: Brisk cap refill Lungs: No accessory muscle use Skin: Small wound on R heel with dried blood. With Otoscope magnified view, there is a tiny piece of glass on the external edge distally. Of note, I could not view this after the X-ray.  Psych: Age appropriate judgment and insight, normal affect, anxious  Assessment and Plan: Contact with sharp glass, initial encounter - Plan: DG Foot Complete Right, X-ray shows FB in heel consistent with glass and where her injury is.  Need for tetanus booster - Plan: Tdap vaccine greater than or equal to 7yo IM  Foreign body (FB) in soft tissue - Plan: Ambulatory referral to Podiatry  Small piece of glass in foot. Will try to get in with podiatry today. F/u prn otherwise.  The patient voiced understanding and agreement to the plan.  Greater than 25 minutes were spent face to face with the patient with greater than 50% of this time spent counseling on diagnosis, imaging, follow up care with specialist and coordinating care.  [place], DO 05/04/19  8:31  AM

## 2019-05-12 ENCOUNTER — Other Ambulatory Visit: Payer: Self-pay | Admitting: Family Medicine

## 2019-05-12 DIAGNOSIS — I10 Essential (primary) hypertension: Secondary | ICD-10-CM

## 2019-09-03 ENCOUNTER — Other Ambulatory Visit: Payer: Self-pay

## 2019-09-03 ENCOUNTER — Encounter (HOSPITAL_BASED_OUTPATIENT_CLINIC_OR_DEPARTMENT_OTHER): Payer: Self-pay | Admitting: *Deleted

## 2019-09-03 DIAGNOSIS — R110 Nausea: Secondary | ICD-10-CM | POA: Insufficient documentation

## 2019-09-03 DIAGNOSIS — R1030 Lower abdominal pain, unspecified: Secondary | ICD-10-CM | POA: Diagnosis present

## 2019-09-03 DIAGNOSIS — Z5321 Procedure and treatment not carried out due to patient leaving prior to being seen by health care provider: Secondary | ICD-10-CM | POA: Insufficient documentation

## 2019-09-03 NOTE — ED Triage Notes (Signed)
Pt c/o lower abd pain and nausea x 2 hrs

## 2019-09-04 ENCOUNTER — Emergency Department (HOSPITAL_BASED_OUTPATIENT_CLINIC_OR_DEPARTMENT_OTHER)
Admission: EM | Admit: 2019-09-04 | Discharge: 2019-09-04 | Disposition: A | Discharge status: Home / Self Care | Payer: BC Managed Care – PPO | Attending: Emergency Medicine | Admitting: Emergency Medicine

## 2019-09-04 LAB — URINALYSIS, ROUTINE W REFLEX MICROSCOPIC
Bilirubin Urine: NEGATIVE
Glucose, UA: NEGATIVE mg/dL
Hgb urine dipstick: NEGATIVE
Ketones, ur: NEGATIVE mg/dL
Leukocytes,Ua: NEGATIVE
Nitrite: NEGATIVE
Protein, ur: NEGATIVE mg/dL
Specific Gravity, Urine: 1.02 (ref 1.005–1.030)
pH: 7.5 (ref 5.0–8.0)

## 2019-09-04 LAB — PREGNANCY, URINE: Preg Test, Ur: NEGATIVE

## 2019-10-25 ENCOUNTER — Other Ambulatory Visit: Payer: Self-pay | Admitting: Family Medicine

## 2019-10-25 DIAGNOSIS — E876 Hypokalemia: Secondary | ICD-10-CM

## 2019-11-23 ENCOUNTER — Other Ambulatory Visit: Payer: Self-pay | Admitting: Family Medicine

## 2019-11-23 DIAGNOSIS — I10 Essential (primary) hypertension: Secondary | ICD-10-CM

## 2020-07-20 ENCOUNTER — Ambulatory Visit: Payer: BC Managed Care – PPO | Admitting: Family Medicine

## 2021-01-16 IMAGING — DX RIGHT FOOT COMPLETE - 3+ VIEW
3 series · 3 of 3 positions shown · non-contrast
Comparison: None.

CLINICAL DATA: Stepped on glass

EXAM:
RIGHT FOOT COMPLETE - 3+ VIEW

[foot ap]
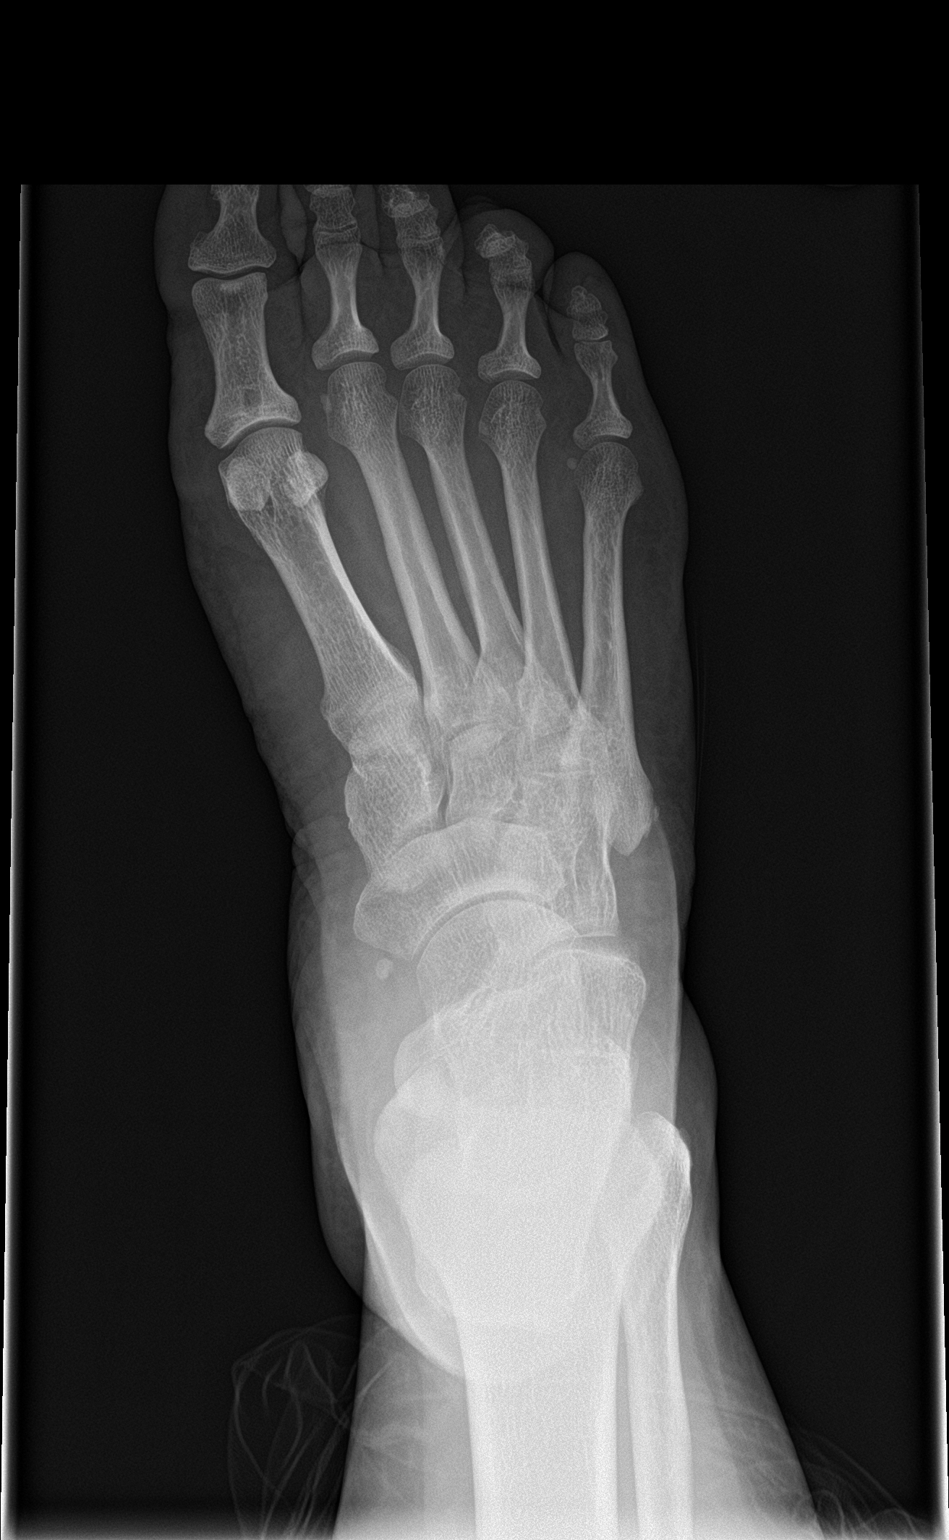

[foot obl]
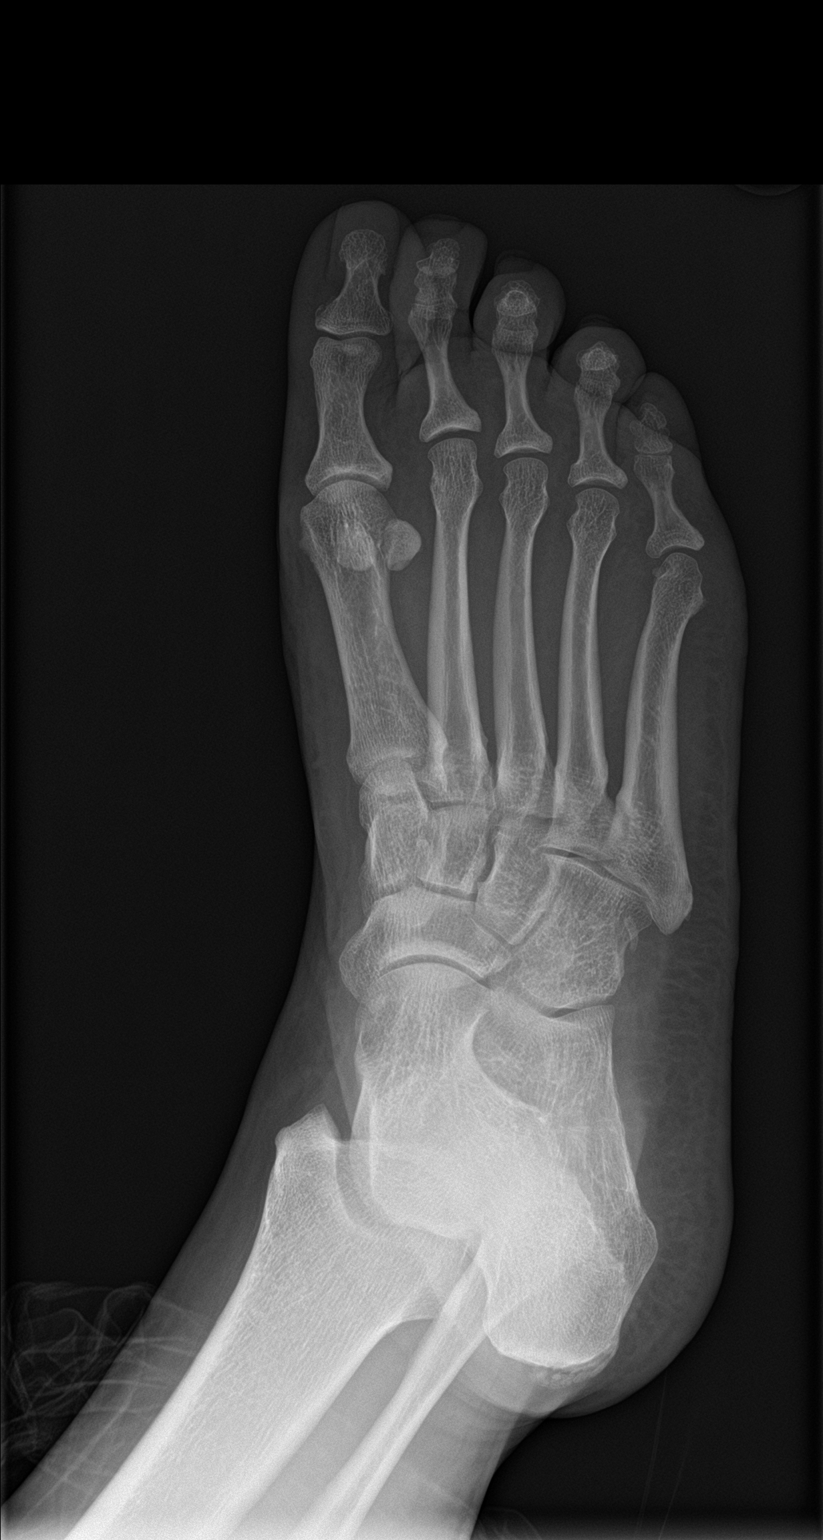

[foot lat]
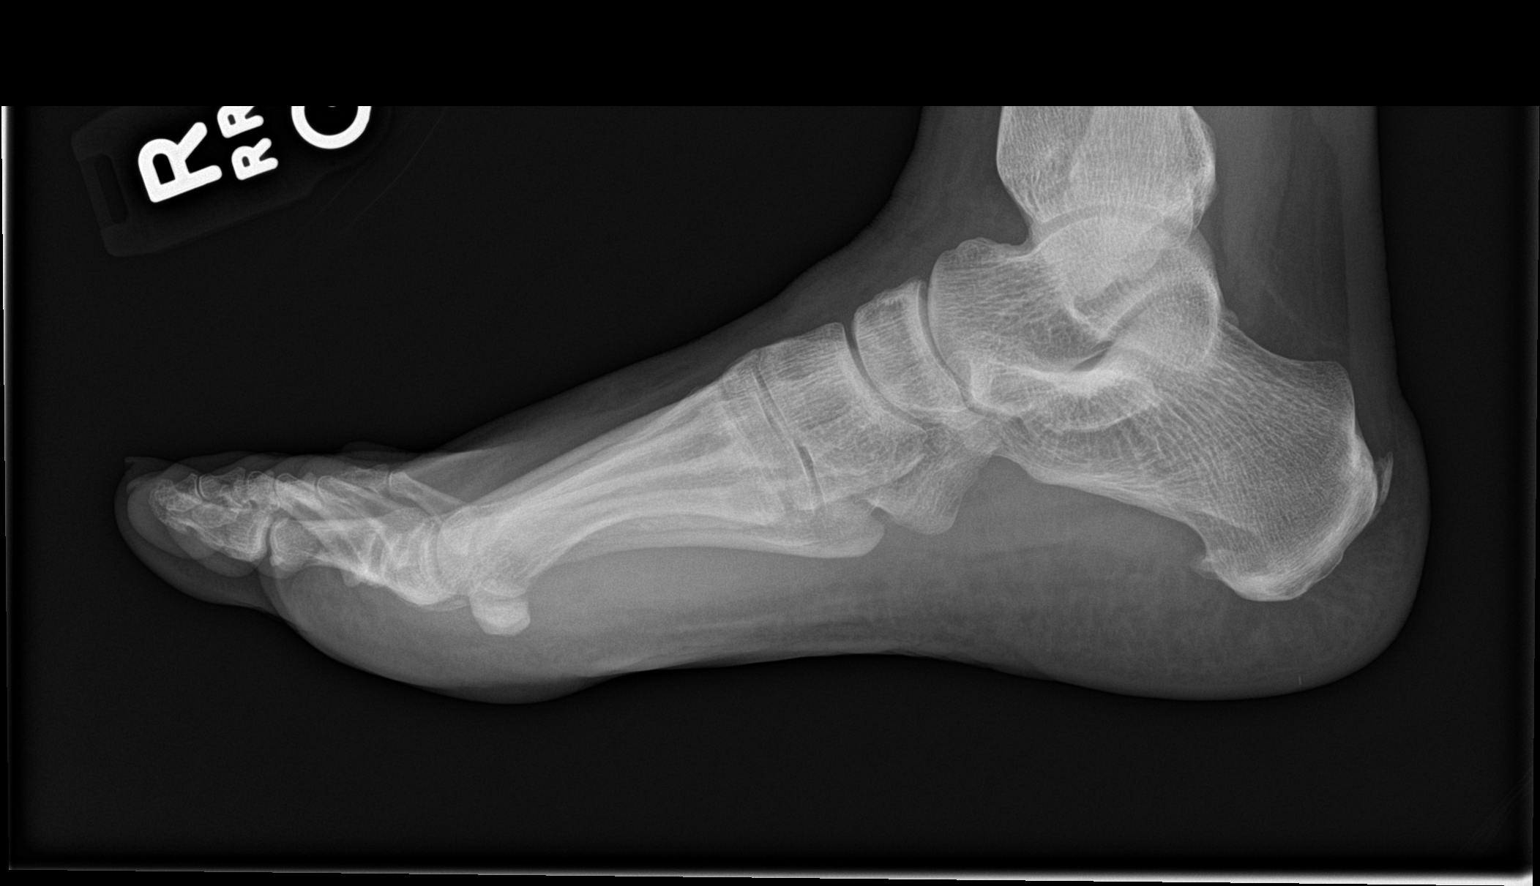

[3 of 3 positions shown; findings below may reference images not displayed]

FINDINGS: No fracture or dislocation of the right foot. Joint spaces are well
preserved. Small plantar and Achilles calcaneal spurs. There is a
tiny, very superficial linear radiodense fragment in the plantar
soft tissues of the heel seen on lateral view, measuring 2 mm in
projected length, in keeping with reported history of glass
fragment.
IMPRESSION: No fracture or dislocation of the right foot. Joint spaces are well
preserved. Small plantar and Achilles calcaneal spurs. There is a
tiny, very superficial linear radiodense fragment in the plantar
soft tissues of the heel seen on lateral view, measuring 2 mm in
projected length, in keeping with reported history of glass
fragment.

## 2023-09-15 ENCOUNTER — Other Ambulatory Visit: Payer: Self-pay

## 2023-09-15 ENCOUNTER — Emergency Department (HOSPITAL_BASED_OUTPATIENT_CLINIC_OR_DEPARTMENT_OTHER)
Admission: EM | Admit: 2023-09-15 | Discharge: 2023-09-15 | Disposition: A | Discharge status: Home / Self Care | Payer: Self-pay | Attending: Emergency Medicine | Admitting: Emergency Medicine

## 2023-09-15 DIAGNOSIS — R112 Nausea with vomiting, unspecified: Secondary | ICD-10-CM | POA: Insufficient documentation

## 2023-09-15 DIAGNOSIS — R197 Diarrhea, unspecified: Secondary | ICD-10-CM | POA: Insufficient documentation

## 2023-09-15 DIAGNOSIS — Z91040 Latex allergy status: Secondary | ICD-10-CM | POA: Insufficient documentation

## 2023-09-15 LAB — COMPREHENSIVE METABOLIC PANEL
ALT: 14 U/L (ref 0–44)
AST: 15 U/L (ref 15–41)
Albumin: 3.7 g/dL (ref 3.5–5.0)
Alkaline Phosphatase: 52 U/L (ref 38–126)
Anion gap: 8 (ref 5–15)
BUN: 11 mg/dL (ref 6–20)
CO2: 24 mmol/L (ref 22–32)
Calcium: 9 mg/dL (ref 8.9–10.3)
Chloride: 104 mmol/L (ref 98–111)
Creatinine, Ser: 0.93 mg/dL (ref 0.44–1.00)
GFR, Estimated: >60 mL/min (ref >60)
Glucose, Bld: 88 mg/dL (ref 70–99)
Potassium: 3.7 mmol/L (ref 3.5–5.1)
Sodium: 136 mmol/L (ref 135–145)
Total Bilirubin: 1 mg/dL (ref <1.2)
Total Protein: 7.6 g/dL (ref 6.5–8.1)

## 2023-09-15 LAB — CBC WITH DIFFERENTIAL/PLATELET
Abs Immature Granulocytes: 0.02 10*3/uL (ref 0.00–0.07)
Basophils Absolute: 0 10*3/uL (ref 0.0–0.1)
Basophils Relative: 0 %
Eosinophils Absolute: 0.3 10*3/uL (ref 0.0–0.5)
Eosinophils Relative: 3 %
HCT: 38 % (ref 36.0–46.0)
Hemoglobin: 12.3 g/dL (ref 12.0–15.0)
Immature Granulocytes: 0 %
Lymphocytes Relative: 18 %
Lymphs Abs: 1.7 10*3/uL (ref 0.7–4.0)
MCH: 26.6 pg (ref 26.0–34.0)
MCHC: 32.4 g/dL (ref 30.0–36.0)
MCV: 82.3 fL (ref 80.0–100.0)
Monocytes Absolute: 0.4 10*3/uL (ref 0.1–1.0)
Monocytes Relative: 4 %
Neutro Abs: 7.1 10*3/uL (ref 1.7–7.7)
Neutrophils Relative %: 75 %
Platelets: 439 10*3/uL — ABNORMAL HIGH (ref 150–400)
RBC: 4.62 MIL/uL (ref 3.87–5.11)
RDW: 14.2 % (ref 11.5–15.5)
WBC: 9.5 10*3/uL (ref 4.0–10.5)
nRBC: 0 % (ref 0.0–0.2)

## 2023-09-15 LAB — LIPASE, BLOOD: Lipase: 26 U/L (ref 11–51)

## 2023-09-15 LAB — MAGNESIUM: Magnesium: 1.9 mg/dL (ref 1.7–2.4)

## 2023-09-15 LAB — HCG, SERUM, QUALITATIVE: Preg, Serum: NEGATIVE

## 2023-09-15 MED ORDER — SODIUM CHLORIDE 0.9 % IV BOLUS
1000.0000 mL | Freq: Once | INTRAVENOUS | Status: AC
  Administered 2023-09-15: 1000 mL via INTRAVENOUS

## 2023-09-15 MED ORDER — ONDANSETRON HCL 4 MG/2ML IJ SOLN
4.0000 mg | Freq: Once | INTRAMUSCULAR | Status: AC
Start: 2023-09-15 — End: 2023-09-15
  Administered 2023-09-15: 4 mg via INTRAVENOUS
  Filled 2023-09-15: qty 2

## 2023-09-15 MED ORDER — MORPHINE SULFATE (PF) 2 MG/ML IV SOLN
2.0000 mg | Freq: Once | INTRAVENOUS | Status: AC
Start: 2023-09-15 — End: 2023-09-15
  Administered 2023-09-15: 2 mg via INTRAVENOUS
  Filled 2023-09-15: qty 1

## 2023-09-15 MED ORDER — ONDANSETRON 4 MG PO TBDP: ORAL_TABLET | ORAL | 0 refills | Status: AC

## 2023-09-15 MED ORDER — ONDANSETRON 4 MG PO TBDP: ORAL_TABLET | ORAL | 0 refills | Status: DC

## 2023-09-15 NOTE — Discharge Instructions (Signed)
Most commonly this is caused by a virus.  Tends to last for about 48 hours and then improves.  I prescribed you nausea medicine take it as needed for nausea and vomiting.  You can take Imodium, this is over-the-counter at the drugstore.  Follow instructions on the bottle to help with diarrhea.  Please return for inability eat or drink or if you develop abdominal pain.

## 2023-09-15 NOTE — ED Provider Notes (Signed)
[facility] Provider Note   CSN: [id number] Arrival date & time: 09/15/23  0341     History  Chief Complaint  Patient presents with   Emesis    Shannon Martinez is a 47 y.o. female.  47 yo F with a chief of nausea vomiting and diarrhea.  Going on about 48 hours ago.  No suspicious food intake no sick contacts no recent travel.  No dark or bloody stool.  No abdominal pain.  No urinary symptoms.  Denies fevers.   Emesis      Home Medications Prior to Admission medications   Medication Sig Start Date End Date Taking? Authorizing Provider  ondansetron (ZOFRAN-ODT) 4 MG disintegrating tablet 4mg  ODT q4 hours prn nausea/vomit 09/15/23  Yes Melene Plan, DO  albuterol (PROVENTIL HFA;VENTOLIN HFA) 108 (90 BASE) MCG/ACT inhaler Inhale 2 puffs into the lungs every 6 (six) hours as needed for wheezing.    [provider]  diltiazem (TIAZAC) 120 MG 24 hr capsule Take 1 capsule by mouth once daily 11/24/19   Copland, Gwenlyn Found, MD  EPINEPHrine (EPI-PEN) 0.3 mg/0.3 mL DEVI Inject 0.3 mLs (0.3 mg total) into the muscle once. 01/08/12   Collene Gobble, MD  hydrochlorothiazide (HYDRODIURIL) 25 MG tablet Take 1 tablet (25 mg total) by mouth daily. 10/29/18   Copland, Gwenlyn Found, MD  ipratropium-albuterol (DUONEB) 0.5-2.5 (3) MG/3ML SOLN Take 3 mLs by nebulization every 4 (four) hours as needed. 02/03/17   Copland, Gwenlyn Found, MD  meloxicam (MOBIC) 7.5 MG tablet Take 1 tablet (7.5 mg total) by mouth daily as needed for pain. 10/14/18   Copland, Gwenlyn Found, MD  Multiple Vitamins-Minerals (MULTIVITAMIN ADULT PO) Take by mouth.    [provider]  potassium chloride SA (K-DUR,KLOR-CON) 20 MEQ tablet Take 1 tablet (20 mEq total) by mouth daily. Take for 3 days, hold the rest of rx for later use 10/29/18   Copland, Gwenlyn Found, MD  SUMAtriptan (IMITREX) 50 MG tablet Take 1 tablet (50 mg total) by mouth every 2 (two) hours as needed for migraine. Max 100  mg daily Patient not taking: Reported on 12/04/2014 11/28/14 12/15/14  Copland, Gwenlyn Found, MD      Allergies    Latex, Almond oil, Lisinopril, Other, Oxycodone, Penicillins, and Losartan    Review of Systems   Review of Systems  Gastrointestinal:  Positive for vomiting.    Physical Exam Updated Vital Signs BP (!) 149/81   Pulse 97   Temp 97.9 F (36.6 C) (Oral)   Resp 18   Ht 5\' 4"  (1.626 m)   Wt 81.6 kg   SpO2 100%   BMI 30.90 kg/m  Physical Exam Vitals and nursing note reviewed.  Constitutional:      General: She is not in acute distress.    Appearance: She is well-developed. She is not diaphoretic.  HENT:     Head: Normocephalic and atraumatic.  Eyes:     Pupils: Pupils are equal, round, and reactive to light.  Cardiovascular:     Rate and Rhythm: Normal rate and regular rhythm.     Heart sounds: No murmur heard.    No friction rub. No gallop.  Pulmonary:     Effort: Pulmonary effort is normal.     Breath sounds: No wheezing or rales.  Abdominal:     General: There is no distension.     Palpations: Abdomen is soft.     Tenderness: There is no abdominal tenderness.  Musculoskeletal:        General: No tenderness.     Cervical back: Normal range of motion and neck supple.  Skin:    General: Skin is warm and dry.  Neurological:     Mental Status: She is alert and oriented to person, place, and time.  Psychiatric:        Behavior: Behavior normal.     ED Results / Procedures / Treatments   Labs (all labs ordered are listed, but only abnormal results are displayed) Labs Reviewed  CBC WITH DIFFERENTIAL/PLATELET - Abnormal; Notable for the following components:      Result Value   Platelets 439 (*)    All other components within normal limits  COMPREHENSIVE METABOLIC PANEL  LIPASE, BLOOD  MAGNESIUM  HCG, SERUM, QUALITATIVE    EKG None  Radiology No results found.  Procedures Procedures    Medications Ordered in ED Medications  sodium chloride  0.9 % bolus 1,000 mL (1,000 mLs Intravenous New Bag/Given 09/15/23 0429)  ondansetron (ZOFRAN) injection 4 mg (4 mg Intravenous Given 09/15/23 0435)  morphine (PF) 2 MG/ML injection 2 mg (2 mg Intravenous Given 09/15/23 0436)    ED Course/ Medical Decision Making/ A&P                                 Medical Decision Making Amount and/or Complexity of Data Reviewed Labs: ordered.  Risk Prescription drug management.   47 yo F with a chief complaint of nausea vomiting and diarrhea going on for about 48 hours now.  Will give a bolus of IV fluids check blood work treat pain and nausea reassess.  Patient feeling better on repeat assessment.  No leukocytosis no anemia, no significant electrolyte abnormalities LFTs and lipase are unremarkable.  Pregnancy test is negative.  Will discharge home.  PCP follow-up.  5:06 AM:  I have discussed the diagnosis/risks/treatment options with the patient and family.  Evaluation and diagnostic testing in the emergency department does not suggest an emergent condition requiring admission or immediate intervention beyond what has been performed at this time.  They will follow up with PCP. We also discussed returning to the ED immediately if new or worsening sx occur. We discussed the sx which are most concerning (e.g., sudden worsening pain, fever, inability to tolerate by mouth) that necessitate immediate return. Medications administered to the patient during their visit and any new prescriptions provided to the patient are listed below.  Medications given during this visit Medications  sodium chloride 0.9 % bolus 1,000 mL (1,000 mLs Intravenous New Bag/Given 09/15/23 0429)  ondansetron (ZOFRAN) injection 4 mg (4 mg Intravenous Given 09/15/23 0435)  morphine (PF) 2 MG/ML injection 2 mg (2 mg Intravenous Given 09/15/23 0436)     The patient appears reasonably screen and/or stabilized for discharge and I doubt any other medical condition or other [facility] requiring  further screening, evaluation, or treatment in the ED at this time prior to discharge.          Final Clinical Impression(s) / ED Diagnoses Final diagnoses:  Nausea vomiting and diarrhea    Rx / DC Orders ED Discharge Orders          Ordered    ondansetron (ZOFRAN-ODT) 4 MG disintegrating tablet        09/15/23 0502              Melene Plan, DO 09/15/23 [phone]

## 2023-09-15 NOTE — ED Triage Notes (Signed)
Pt c/o N/V/D and weakness since this past Saturday.

## 2024-12-15 ENCOUNTER — Ambulatory Visit: Admitting: Allergy
